# Patient Record
Sex: Male | Born: 1985 | Race: White | Hispanic: No | Marital: Single | Smoking: Current every day smoker
Health system: Southern US, Community
[De-identification: ages and names within clinical notes are randomized; demographics above are authoritative.]

## PROBLEM LIST (undated history)

## (undated) DIAGNOSIS — G43909 Migraine, unspecified, not intractable, without status migrainosus: Secondary | ICD-10-CM

## (undated) DIAGNOSIS — Z8661 Personal history of infections of the central nervous system: Secondary | ICD-10-CM

## (undated) HISTORY — PX: TONSILLECTOMY: SUR1361

---
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
## 2008-05-13 ENCOUNTER — Ambulatory Visit (HOSPITAL_COMMUNITY): Admission: RE | Admit: 2008-05-13 | Discharge: 2008-05-13 | Payer: Self-pay | Admitting: Family Medicine

## 2008-10-01 ENCOUNTER — Emergency Department (HOSPITAL_COMMUNITY): Admission: EM | Admit: 2008-10-01 | Discharge: 2008-10-01 | Payer: Self-pay | Admitting: Emergency Medicine

## 2010-02-02 ENCOUNTER — Emergency Department (HOSPITAL_COMMUNITY): Admission: EM | Admit: 2010-02-02 | Discharge: 2010-02-02 | Payer: Self-pay | Admitting: Emergency Medicine

## 2010-05-03 ENCOUNTER — Emergency Department (HOSPITAL_COMMUNITY): Admission: EM | Admit: 2010-05-03 | Discharge: 2010-05-03 | Payer: Self-pay | Admitting: Emergency Medicine

## 2010-09-07 ENCOUNTER — Emergency Department (HOSPITAL_COMMUNITY): Admission: EM | Admit: 2010-09-07 | Discharge: 2010-09-07 | Payer: Self-pay | Admitting: Emergency Medicine

## 2010-10-01 ENCOUNTER — Emergency Department (HOSPITAL_COMMUNITY): Admission: EM | Admit: 2010-10-01 | Discharge: 2010-10-01 | Payer: Self-pay | Admitting: Emergency Medicine

## 2011-01-15 ENCOUNTER — Encounter: Payer: Self-pay | Admitting: Neurology

## 2011-01-26 ENCOUNTER — Emergency Department (HOSPITAL_COMMUNITY)
Admission: EM | Admit: 2011-01-26 | Discharge: 2011-01-26 | Disposition: A | Discharge status: Home / Self Care | Payer: Self-pay | Attending: Emergency Medicine | Admitting: Emergency Medicine

## 2011-01-26 DIAGNOSIS — K047 Periapical abscess without sinus: Secondary | ICD-10-CM | POA: Insufficient documentation

## 2011-01-26 DIAGNOSIS — K089 Disorder of teeth and supporting structures, unspecified: Secondary | ICD-10-CM | POA: Insufficient documentation

## 2011-03-09 LAB — DIFFERENTIAL
Basophils Absolute: 0 10*3/uL (ref 0.0–0.1)
Basophils Relative: 0 % (ref 0–1)
Lymphocytes Relative: 24 % (ref 12–46)
Monocytes Absolute: 0.4 10*3/uL (ref 0.1–1.0)
Monocytes Relative: 7 % (ref 3–12)
Neutro Abs: 3.5 10*3/uL (ref 1.7–7.7)
Neutrophils Relative %: 67 % (ref 43–77)

## 2011-03-09 LAB — BASIC METABOLIC PANEL
Calcium: 9.3 mg/dL (ref 8.4–10.5)
GFR calc non Af Amer: >60 mL/min (ref >60)
Potassium: 3.9 mEq/L (ref 3.5–5.1)
Sodium: 137 mEq/L (ref 135–145)

## 2011-03-09 LAB — RAPID STREP SCREEN (MED CTR MEBANE ONLY): Streptococcus, Group A Screen (Direct): NEGATIVE

## 2011-03-09 LAB — CBC
HCT: 46.9 % (ref 39.0–52.0)
Hemoglobin: 16.3 g/dL (ref 13.0–17.0)
MCHC: 34.7 g/dL (ref 30.0–36.0)
RDW: 12.8 % (ref 11.5–15.5)
WBC: 5.3 10*3/uL (ref 4.0–10.5)

## 2011-10-09 ENCOUNTER — Emergency Department (HOSPITAL_COMMUNITY)
Admission: EM | Admit: 2011-10-09 | Discharge: 2011-10-09 | Disposition: A | Discharge status: Home / Self Care | Payer: Self-pay | Attending: Emergency Medicine | Admitting: Emergency Medicine

## 2011-10-09 ENCOUNTER — Encounter: Payer: Self-pay | Admitting: *Deleted

## 2011-10-09 DIAGNOSIS — J029 Acute pharyngitis, unspecified: Secondary | ICD-10-CM | POA: Insufficient documentation

## 2011-10-09 HISTORY — DX: Migraine, unspecified, not intractable, without status migrainosus: G43.909

## 2011-10-09 MED ORDER — IBUPROFEN 800 MG PO TABS
800.0000 mg | ORAL_TABLET | Freq: Once | ORAL | Status: AC
  Administered 2011-10-09: 800 mg via ORAL
  Filled 2011-10-09: qty 1

## 2011-10-09 NOTE — ED Notes (Signed)
C/o painful knot in right side of throat x 2 days

## 2011-10-09 NOTE — ED Notes (Signed)
Sore throat for 3-4 days, says he noticed white spots on throat.  Alert, NAD

## 2011-10-09 NOTE — ED Provider Notes (Signed)
History     CSN: [id number] Arrival date & time: 10/09/2011  9:38 PM  Chief Complaint  Patient presents with  . Sore Throat    (Consider location/radiation/quality/duration/timing/severity/associated sxs/prior treatment) Patient is a 25 y.o. male presenting with pharyngitis. The history is provided by the patient and a parent.  Sore Throat This is a new problem. Episode onset: 5 days ago. The problem occurs constantly. Associated symptoms include a sore throat and swollen glands. Pertinent negatives include no fever, headaches or nausea. The symptoms are aggravated by swallowing. He has tried nothing for the symptoms.    Past Medical History  Diagnosis Date  . Migraines     Past Surgical History  Procedure Date  . Tonsillectomy     No family history on file.  History  Substance Use Topics  . Smoking status: Current Everyday Smoker  . Smokeless tobacco: Not on file  . Alcohol Use: No      Review of Systems  Constitutional: Negative for fever.  HENT: Positive for sore throat.   Gastrointestinal: Negative for nausea.  Neurological: Negative for headaches.  All other systems reviewed and are negative.    Allergies  Review of patient's allergies indicates no known allergies.  Home Medications  No current outpatient prescriptions on file.  BP 135/77  Pulse 82  Temp(Src) 98.7 F (37.1 C) (Oral)  Resp 24  Ht 5\' 11"  (1.803 m)  Wt 182 lb (82.555 kg)  BMI 25.38 kg/m2  SpO2 99%  Physical Exam  Nursing note and vitals reviewed. Constitutional: He is oriented to person, place, and time. He appears well-developed and well-nourished.  HENT:  Head: Normocephalic and atraumatic.  Right Ear: External ear normal.  Left Ear: External ear normal.  Mouth/Throat: Uvula is midline and mucous membranes are normal. No uvula swelling. Oropharyngeal exudate present. No posterior oropharyngeal edema, posterior oropharyngeal erythema or tonsillar abscesses.  Eyes: EOM are  normal.  Neck: Normal range of motion.  Cardiovascular: Normal rate, regular rhythm, normal heart sounds and intact distal pulses.   Pulmonary/Chest: Effort normal and breath sounds normal. No respiratory distress. He has no wheezes. He has no rales. He exhibits no tenderness.  Abdominal: Soft. He exhibits no distension. There is no splenomegaly. There is no tenderness.  Musculoskeletal: Normal range of motion.  Lymphadenopathy:    He has cervical adenopathy.  Neurological: He is alert and oriented to person, place, and time.  Skin: Skin is warm and dry.  Psychiatric: He has a normal mood and affect. Judgment normal.    ED Course  Procedures (including critical care time)   Labs Reviewed  RAPID STREP SCREEN   No results found.   No diagnosis found.    MDM  Pt is to f/u with dr. Gerda Diss.  DNA probe for strep A pending.        Worthy Rancher, PA 10/09/11 [phone]

## 2011-10-11 LAB — STREP A DNA PROBE: Group A Strep Probe: NEGATIVE

## 2011-10-16 NOTE — ED Provider Notes (Signed)
Medical screening examination/treatment/procedure(s) were performed by non-physician practitioner and as supervising physician I was immediately available for consultation/collaboration.  Geovanna Simko S. Trish Mancinelli, MD 10/16/11 0444 

## 2012-03-13 IMAGING — CR DG CHEST 2V
2 series · 2 of 2 positions shown · non-contrast
Comparison: None.

CLINICAL DATA: Cough, congestion, fever

CHEST - 2 VIEW

[view not recorded (1 of 2)]
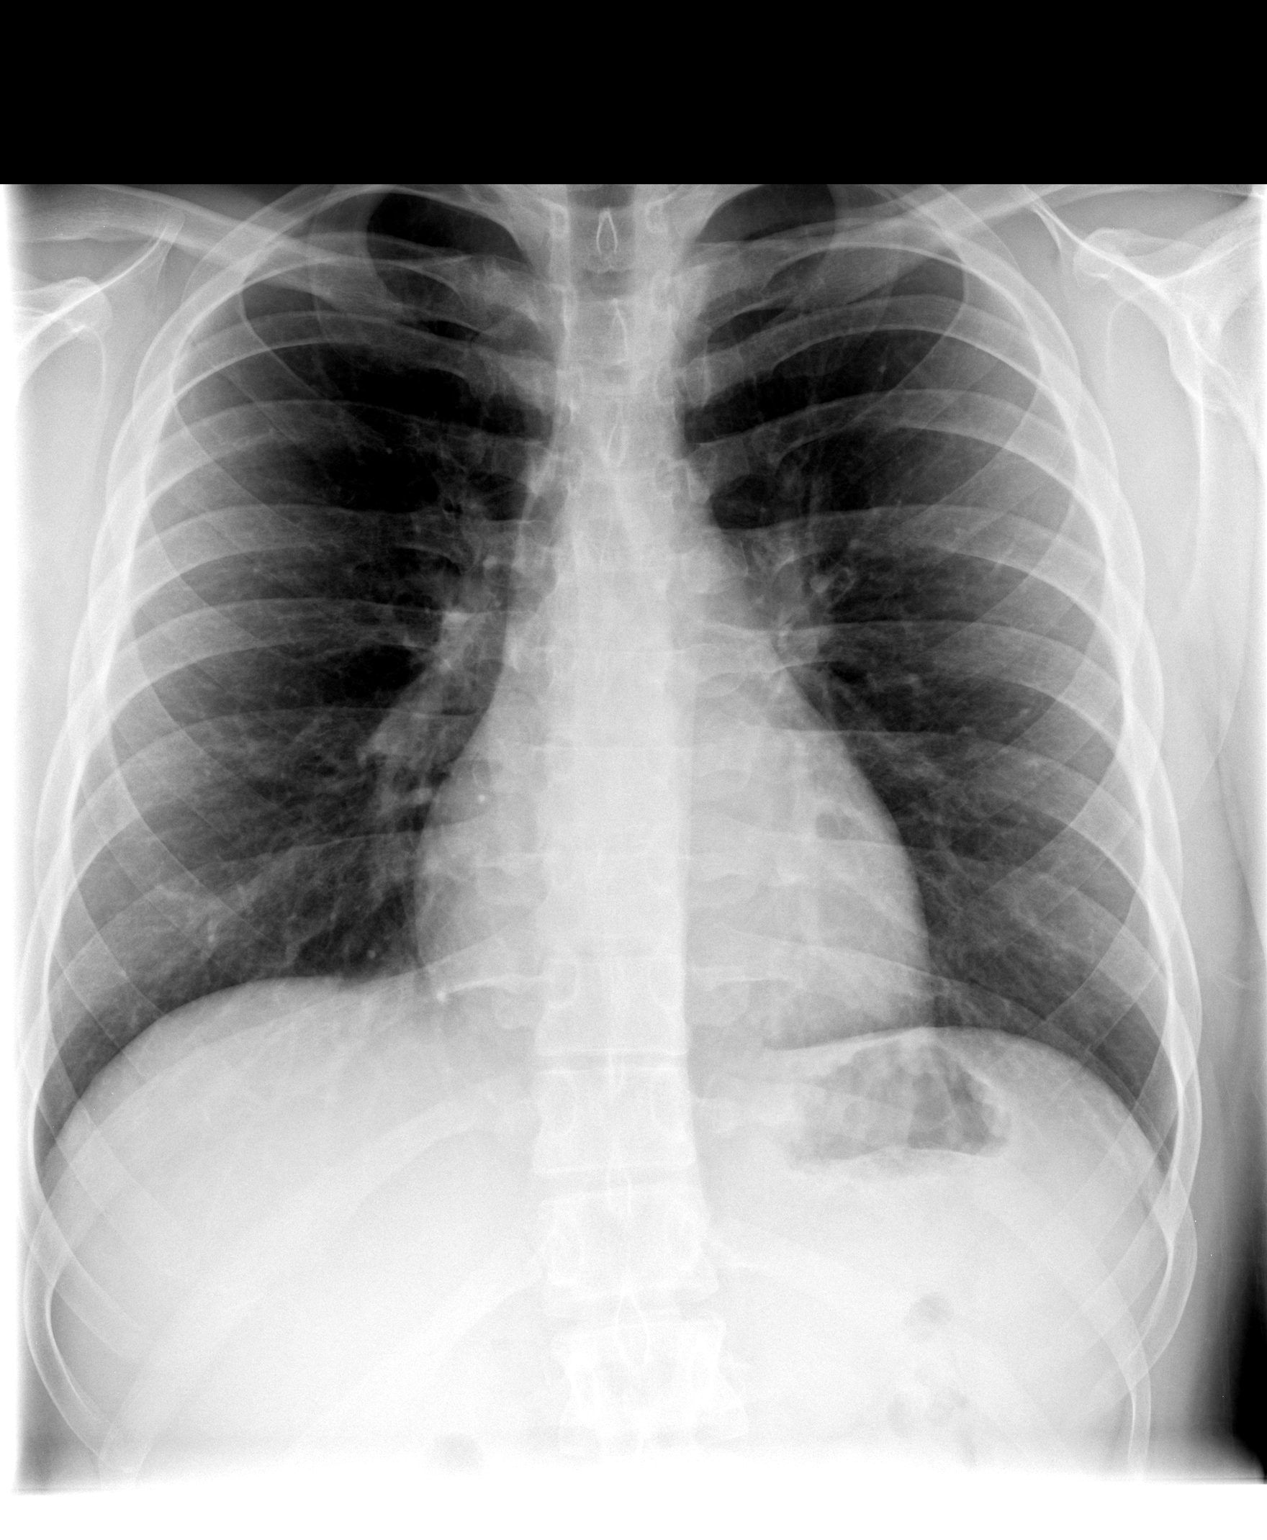

[view not recorded (2 of 2)]
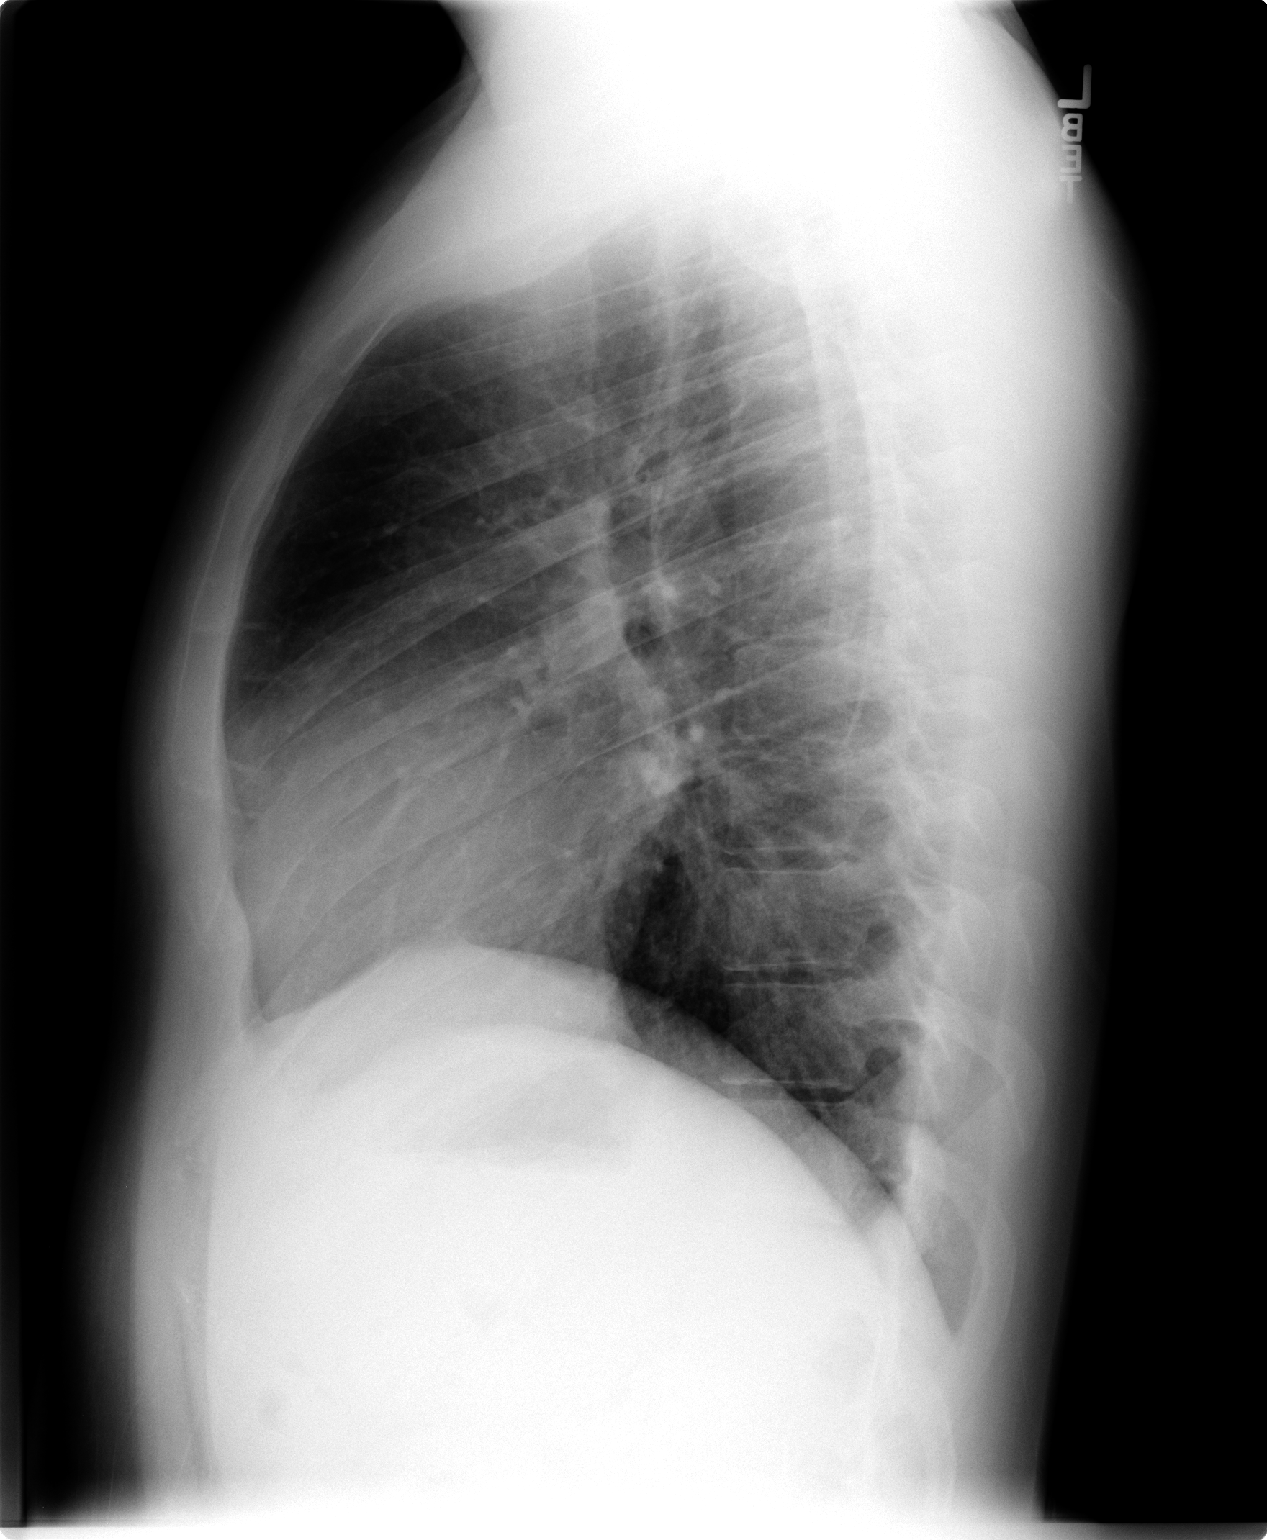

[2 of 2 positions shown; findings below may reference images not displayed]

FINDINGS: No active infiltrate or effusion is seen.  Mediastinal
contours appear normal.  The heart is within normal limits in size.
No bony abnormality is noted.
IMPRESSION: No active lung disease.

## 2013-05-08 ENCOUNTER — Encounter (HOSPITAL_COMMUNITY): Payer: Self-pay | Admitting: Emergency Medicine

## 2013-05-08 ENCOUNTER — Emergency Department (HOSPITAL_COMMUNITY)
Admission: EM | Admit: 2013-05-08 | Discharge: 2013-05-08 | Disposition: A | Discharge status: Home / Self Care | Payer: Self-pay | Attending: Emergency Medicine | Admitting: Emergency Medicine

## 2013-05-08 DIAGNOSIS — IMO0001 Reserved for inherently not codable concepts without codable children: Secondary | ICD-10-CM | POA: Insufficient documentation

## 2013-05-08 DIAGNOSIS — F17200 Nicotine dependence, unspecified, uncomplicated: Secondary | ICD-10-CM | POA: Insufficient documentation

## 2013-05-08 DIAGNOSIS — J029 Acute pharyngitis, unspecified: Secondary | ICD-10-CM | POA: Insufficient documentation

## 2013-05-08 DIAGNOSIS — R509 Fever, unspecified: Secondary | ICD-10-CM | POA: Insufficient documentation

## 2013-05-08 DIAGNOSIS — Z8679 Personal history of other diseases of the circulatory system: Secondary | ICD-10-CM | POA: Insufficient documentation

## 2013-05-08 LAB — RAPID STREP SCREEN (MED CTR MEBANE ONLY): Streptococcus, Group A Screen (Direct): NEGATIVE

## 2013-05-08 MED ORDER — PENICILLIN V POTASSIUM 500 MG PO TABS: 500.0000 mg | ORAL_TABLET | Freq: Four times a day (QID) | ORAL | Status: AC

## 2013-05-08 MED ORDER — HYDROCODONE-ACETAMINOPHEN 5-325 MG PO TABS: 1.0000 | ORAL_TABLET | ORAL | Status: DC | PRN

## 2013-05-08 NOTE — ED Provider Notes (Signed)
History     CSN: [id number]  Arrival date & time 05/08/13  1544   First MD Initiated Contact with Patient 05/08/13 [phone]      Chief Complaint  Patient presents with  . Chills  . Generalized Body Aches  . Fever    (Consider location/radiation/quality/duration/timing/severity/associated sxs/prior treatment) Patient is a 27 y.o. male presenting with pharyngitis. The history is provided by the patient.  Sore Throat This is a new problem. The current episode started in the past 7 days. The problem occurs constantly. The problem has been gradually worsening. Associated symptoms include chills, a fever, myalgias, nausea and a sore throat. Pertinent negatives include no abdominal pain, anorexia, change in bowel habit, chest pain, congestion, coughing, rash, swollen glands, urinary symptoms, visual change or vomiting. Headaches: mild. Neck pain: with movement. The symptoms are aggravated by eating, coughing, smoking and swallowing. He has tried NSAIDs for the symptoms. The treatment provided mild relief.   ADD DINAPOLI is a 27 y.o. male who presents to the ED with sore throat, fever and chills.   Past Medical History  Diagnosis Date  . Migraines     Past Surgical History  Procedure Laterality Date  . Tonsillectomy      History reviewed. No pertinent family history.  History  Substance Use Topics  . Smoking status: Current Every Day Smoker -- 1.00 packs/day    Types: Cigarettes  . Smokeless tobacco: Never Used  . Alcohol Use: No      Review of Systems  Constitutional: Positive for fever and chills.  HENT: Positive for sore throat. Negative for ear pain, congestion, neck stiffness and ear discharge. Neck pain: with movement.   Respiratory: Negative for cough and chest tightness.   Cardiovascular: Negative for chest pain.  Gastrointestinal: Positive for nausea. Negative for vomiting, abdominal pain, anorexia and change in bowel habit.  Genitourinary: Negative for dysuria and  urgency.  Musculoskeletal: Positive for myalgias.  Skin: Negative for rash.  Allergic/Immunologic: Negative for immunocompromised state.  Neurological: Headaches: mild.  Psychiatric/Behavioral: The patient is not nervous/anxious.     Allergies  Review of patient's allergies indicates no known allergies.  Home Medications  No current outpatient prescriptions on file.  BP 121/65  Pulse 102  Temp(Src) 100.9 F (38.3 C)  Resp 16  Ht 5\' 11"  (1.803 m)  Wt 192 lb (87.091 kg)  BMI 26.79 kg/m2  SpO2 99%  Physical Exam  Nursing note and vitals reviewed. Constitutional: He is oriented to person, place, and time. He appears well-developed and well-nourished. No distress.  HENT:  Head: Normocephalic and atraumatic.  Right Ear: Tympanic membrane normal.  Left Ear: Tympanic membrane normal.  Nose: Nose normal.  Mouth/Throat: Uvula is midline and mucous membranes are normal. Oropharyngeal exudate and posterior oropharyngeal erythema present.  Eyes: Conjunctivae and EOM are normal. Pupils are equal, round, and reactive to light.  Neck: Neck supple.  Cardiovascular: Tachycardia present.   Pulmonary/Chest: Effort normal and breath sounds normal. He has no wheezes.  Abdominal: Soft. Bowel sounds are normal. There is no tenderness. There is no CVA tenderness.  Musculoskeletal: Normal range of motion.  Lymphadenopathy:    He has cervical adenopathy.  Neurological: He is alert and oriented to person, place, and time. No cranial nerve deficit.  Skin: Skin is warm and dry.  Psychiatric: He has a normal mood and affect. His behavior is normal. Judgment and thought content normal.    Assessment: 27 y.o. male with sore throat and fever   Pharyngitis  Plan:  Culture sent for strep   Treat with antibiotics   Follow up with PCP  ED Course  Procedures (including critical care time)  MDM  I have reviewed this patient's vital signs, nurses notes and discussed clinical findings and plan of care  with the patient and his family. Patient stable for discharge home.          [facility] Orlene Och, [place] 05/08/13 [phone]

## 2013-05-08 NOTE — ED Notes (Signed)
Pt states that he has been having flu-like symptoms since Tuesday. Pt reports fever, chills and body aches.

## 2013-05-08 NOTE — ED Provider Notes (Signed)
Medical screening examination/treatment/procedure(s) were performed by non-physician practitioner and as supervising physician I was immediately available for consultation/collaboration.   Joya Gaskins, MD 05/08/13 [phone]

## 2013-08-11 ENCOUNTER — Encounter (HOSPITAL_COMMUNITY): Payer: Self-pay | Admitting: Emergency Medicine

## 2013-08-11 ENCOUNTER — Emergency Department (HOSPITAL_COMMUNITY)
Admission: EM | Admit: 2013-08-11 | Discharge: 2013-08-11 | Disposition: A | Discharge status: Home / Self Care | Payer: BC Managed Care – PPO | Attending: Emergency Medicine | Admitting: Emergency Medicine

## 2013-08-11 DIAGNOSIS — K089 Disorder of teeth and supporting structures, unspecified: Secondary | ICD-10-CM | POA: Insufficient documentation

## 2013-08-11 DIAGNOSIS — F17200 Nicotine dependence, unspecified, uncomplicated: Secondary | ICD-10-CM | POA: Insufficient documentation

## 2013-08-11 DIAGNOSIS — Z8679 Personal history of other diseases of the circulatory system: Secondary | ICD-10-CM | POA: Insufficient documentation

## 2013-08-11 DIAGNOSIS — K029 Dental caries, unspecified: Secondary | ICD-10-CM

## 2013-08-11 DIAGNOSIS — R599 Enlarged lymph nodes, unspecified: Secondary | ICD-10-CM | POA: Insufficient documentation

## 2013-08-11 MED ORDER — AMOXICILLIN 500 MG PO CAPS: 500.0000 mg | ORAL_CAPSULE | Freq: Three times a day (TID) | ORAL | Status: DC

## 2013-08-11 MED ORDER — NAPROXEN 500 MG PO TABS: 500.0000 mg | ORAL_TABLET | Freq: Two times a day (BID) | ORAL | Status: DC

## 2013-08-11 MED ORDER — HYDROCODONE-ACETAMINOPHEN 5-325 MG PO TABS
1.0000 | ORAL_TABLET | Freq: Once | ORAL | Status: AC
  Administered 2013-08-11: 1 via ORAL
  Filled 2013-08-11: qty 1

## 2013-08-11 MED ORDER — AMOXICILLIN 250 MG PO CAPS
500.0000 mg | ORAL_CAPSULE | Freq: Once | ORAL | Status: AC
  Administered 2013-08-11: 500 mg via ORAL
  Filled 2013-08-11: qty 2

## 2013-08-11 NOTE — ED Provider Notes (Signed)
CSN: [id number]     Arrival date & time 08/11/13  1913 History     First MD Initiated Contact with Patient 08/11/13 1923     Chief Complaint  Patient presents with  . Dental Pain   (Consider location/radiation/quality/duration/timing/severity/associated sxs/prior Treatment) Patient is a 27 y.o. male presenting with tooth pain. The history is provided by the patient.  Dental Pain Location:  Lower Lower teeth location:  18/LL 2nd molar Quality:  Throbbing and constant Severity:  Moderate Onset quality:  Gradual Duration:  24 hours Timing:  Constant Progression:  Worsening Chronicity:  New Context: dental caries   Relieved by:  Nothing Worsened by:  Cold food/drink and hot food/drink Associated symptoms: no congestion, no difficulty swallowing, no facial swelling, no fever, no headaches, no neck pain, no oral bleeding and no trismus   Risk factors: lack of dental care and smoking    Philip Hughes is a 27 y.o. male who presents to the ED with dental pain that started last night.    Past Medical History  Diagnosis Date  . Migraines    Past Surgical History  Procedure Laterality Date  . Tonsillectomy     No family history on file. History  Substance Use Topics  . Smoking status: Current Every Day Smoker -- 1.00 packs/day    Types: Cigarettes  . Smokeless tobacco: Never Used  . Alcohol Use: Yes    Review of Systems  Constitutional: Negative for fever and chills.  HENT: Positive for dental problem. Negative for congestion, sore throat, facial swelling, trouble swallowing and neck pain.   Respiratory: Negative for shortness of breath.   Gastrointestinal: Negative for nausea and vomiting.  Skin: Negative for rash.  Neurological: Negative for headaches.  Psychiatric/Behavioral: The patient is not nervous/anxious.     Allergies  Review of patient's allergies indicates no known allergies.  Home Medications   Current Outpatient Rx  Name  Route  Sig  Dispense  Refill   . aspirin 325 MG tablet   Oral   Take 325 mg by mouth every 6 (six) hours as needed for fever.         Marland Kitchen HYDROcodone-acetaminophen (NORCO/VICODIN) 5-325 MG per tablet   Oral   Take 1 tablet by mouth every 4 (four) hours as needed.   10 tablet   0    BP 122/73  Pulse 84  Temp(Src) 98 F (36.7 C) (Oral)  Resp 18  Ht 5\' 11"  (1.803 m)  Wt 192 lb (87.091 kg)  BMI 26.79 kg/m2  SpO2 100% Physical Exam  Nursing note and vitals reviewed. Constitutional: He is oriented to person, place, and time. He appears well-developed and well-nourished.  HENT:  Head: Normocephalic.  Mouth/Throat: Uvula is midline, oropharynx is clear and moist and mucous membranes are normal. Dental caries present.    Eyes: Conjunctivae and EOM are normal.  Neck: Neck supple.  Cardiovascular: Normal rate and regular rhythm.   Pulmonary/Chest: Effort normal and breath sounds normal.  Musculoskeletal: Normal range of motion.  Lymphadenopathy:    He has cervical adenopathy (right).  Neurological: He is alert and oriented to person, place, and time. No cranial nerve deficit.  Skin: Skin is warm and dry.  Psychiatric: He has a normal mood and affect. His behavior is normal. Thought content normal.    ED Course   Procedures  MDM  27 y.o. male with dental pain. Multiple caries. Will treat with antibiotics and medication for inflammation. He is to follow up with the dental  clinic as soon as possible.  Discussed with the patient and all questioned fully answered. He will return here if problems arise.    Medication List    TAKE these medications       amoxicillin 500 MG capsule  Commonly known as:  AMOXIL  Take 1 capsule (500 mg total) by mouth 3 (three) times daily.     naproxen 500 MG tablet  Commonly known as:  NAPROSYN  Take 1 tablet (500 mg total) by mouth 2 (two) times daily.      ASK your doctor about these medications       aspirin 325 MG tablet  Take 325 mg by mouth every 6 (six) hours  as needed for fever.     HYDROcodone-acetaminophen 5-325 MG per tablet  Commonly known as:  NORCO/VICODIN  Take 1 tablet by mouth every 4 (four) hours as needed.         [facility] Orlene Och, [place] 08/11/13 [phone]

## 2013-08-11 NOTE — ED Provider Notes (Signed)
History/physical exam/procedure(s) were performed by non-physician practitioner and as supervising physician I was immediately available for consultation/collaboration. I have reviewed all notes and am in agreement with care and plan.   Clarrissa Shimkus S Mamadou Breon, MD 08/11/13 2349 

## 2013-08-11 NOTE — ED Notes (Signed)
Patient c/o lower left dental pain that started approximately 3 hours ago.

## 2014-12-28 ENCOUNTER — Encounter (HOSPITAL_COMMUNITY): Payer: Self-pay | Admitting: Emergency Medicine

## 2014-12-28 ENCOUNTER — Emergency Department (HOSPITAL_COMMUNITY): Payer: BLUE CROSS/BLUE SHIELD

## 2014-12-28 ENCOUNTER — Emergency Department (HOSPITAL_COMMUNITY)
Admission: EM | Admit: 2014-12-28 | Discharge: 2014-12-28 | Disposition: A | Discharge status: Home / Self Care | Payer: BLUE CROSS/BLUE SHIELD | Attending: Emergency Medicine | Admitting: Emergency Medicine

## 2014-12-28 DIAGNOSIS — Z791 Long term (current) use of non-steroidal anti-inflammatories (NSAID): Secondary | ICD-10-CM | POA: Insufficient documentation

## 2014-12-28 DIAGNOSIS — J0100 Acute maxillary sinusitis, unspecified: Secondary | ICD-10-CM | POA: Insufficient documentation

## 2014-12-28 DIAGNOSIS — J209 Acute bronchitis, unspecified: Secondary | ICD-10-CM | POA: Diagnosis not present

## 2014-12-28 DIAGNOSIS — Z7982 Long term (current) use of aspirin: Secondary | ICD-10-CM | POA: Diagnosis not present

## 2014-12-28 DIAGNOSIS — R059 Cough, unspecified: Secondary | ICD-10-CM

## 2014-12-28 DIAGNOSIS — Z792 Long term (current) use of antibiotics: Secondary | ICD-10-CM | POA: Insufficient documentation

## 2014-12-28 DIAGNOSIS — Z87891 Personal history of nicotine dependence: Secondary | ICD-10-CM | POA: Insufficient documentation

## 2014-12-28 DIAGNOSIS — J40 Bronchitis, not specified as acute or chronic: Secondary | ICD-10-CM

## 2014-12-28 DIAGNOSIS — R05 Cough: Secondary | ICD-10-CM

## 2014-12-28 MED ORDER — AZITHROMYCIN 250 MG PO TABS
500.0000 mg | ORAL_TABLET | Freq: Once | ORAL | Status: AC
  Administered 2014-12-28: 500 mg via ORAL
  Filled 2014-12-28: qty 2

## 2014-12-28 MED ORDER — AZITHROMYCIN 250 MG PO TABS: 250.0000 mg | ORAL_TABLET | Freq: Every day | ORAL | Status: DC

## 2014-12-28 NOTE — ED Notes (Addendum)
PT reports nasal congestion and sinus pressure with reports green nasal drainage x4 days. PT reports using dayquil this am. PT also reports a productive cough with green sputum but denies any SOB.

## 2014-12-28 NOTE — ED Provider Notes (Signed)
CSN: [id number]     Arrival date & time 12/28/14  1236 History  This chart was scribed for Benny Lennert, MD by Ronney Lion, ED Scribe. This patient was seen in room APA09/APA09 and the patient's care was started at 2:45 PM.    Chief Complaint  Patient presents with  . Recurrent Sinusitis   Patient is a 29 y.o. male presenting with cough. The history is provided by the patient (pt complains of a cough). No language interpreter was used.  Cough Cough characteristics:  Non-productive Severity:  Moderate Onset quality:  Gradual Timing:  Constant Progression:  Waxing and waning Chronicity:  New Smoker: no   Context: not animal exposure   Associated symptoms: sore throat   Associated symptoms: no chest pain, no eye discharge, no headaches and no rash    HPI Comments: Philip Hughes is a 29 y.o. male who presents to the Emergency Department complaining of worsening chest congestion, nasal congestion, and sore throat, with onset 4 days ago. He denies smoking.   Past Medical History  Diagnosis Date  . Migraines    Past Surgical History  Procedure Laterality Date  . Tonsillectomy     No family history on file. History  Substance Use Topics  . Smoking status: Former Smoker -- 1.00 packs/day    Types: Cigarettes    Quit date: 05/08/2014  . Smokeless tobacco: Never Used  . Alcohol Use: Yes    Review of Systems  Constitutional: Negative for appetite change and fatigue.  HENT: Positive for congestion, sinus pressure and sore throat. Negative for ear discharge.   Eyes: Negative for discharge.  Respiratory: Positive for cough.   Cardiovascular: Negative for chest pain.  Gastrointestinal: Negative for abdominal pain and diarrhea.  Genitourinary: Negative for frequency and hematuria.  Musculoskeletal: Negative for back pain.  Skin: Negative for rash.  Neurological: Negative for seizures and headaches.  Psychiatric/Behavioral: Negative for hallucinations.      Allergies  Review  of patient's allergies indicates no known allergies.  Home Medications   Prior to Admission medications   Medication Sig Start Date End Date Taking? Authorizing Provider  amoxicillin (AMOXIL) 500 MG capsule Take 1 capsule (500 mg total) by mouth 3 (three) times daily. 08/11/13   Hope Orlene Och, NP  aspirin 325 MG tablet Take 325 mg by mouth every 6 (six) hours as needed for fever.    Historical Provider, MD  HYDROcodone-acetaminophen (NORCO/VICODIN) 5-325 MG per tablet Take 1 tablet by mouth every 4 (four) hours as needed. 05/08/13   Hope Orlene Och, NP  naproxen (NAPROSYN) 500 MG tablet Take 1 tablet (500 mg total) by mouth 2 (two) times daily. 08/11/13   Hope Orlene Och, NP   BP 138/76 mmHg  Pulse 80  Temp(Src) 98.3 F (36.8 C) (Oral)  Resp 18  Ht  (1.803 m)  Wt 198 lb (89.812 kg)  BMI 27.63 kg/m2  SpO2 99% Physical Exam  Constitutional: He is oriented to person, place, and time. He appears well-developed.  HENT:  Head: Normocephalic.  Moderate tenderness to maxillary sinuses.  Eyes: Conjunctivae and EOM are normal. No scleral icterus.  Neck: Neck supple. No thyromegaly present.  Cardiovascular: Normal rate and regular rhythm.  Exam reveals no gallop and no friction rub.   No murmur heard. Pulmonary/Chest: No stridor. He has no wheezes. He has no rales. He exhibits no tenderness.  Abdominal: He exhibits no distension. There is no tenderness. There is no rebound.  Musculoskeletal: Normal range of motion.  He exhibits no edema.  Lymphadenopathy:    He has no cervical adenopathy.  Neurological: He is oriented to person, place, and time. He exhibits normal muscle tone. Coordination normal.  Skin: No rash noted. No erythema.  Psychiatric: He has a normal mood and affect. His behavior is normal.  Nursing note and vitals reviewed.   ED Course  Procedures (including critical care time)  DIAGNOSTIC STUDIES: Oxygen Saturation is 99% on room air, normal by my interpretation.     COORDINATION OF CARE: 2:46 PM - Discussed treatment plan with pt at bedside which includes antibiotic medication, and instructions to increase fluid intake and take OTC medication as needed, and pt agreed to plan.   Labs Review Labs Reviewed - No data to display  Imaging Review Dg Chest 2 View  12/28/2014   CLINICAL DATA:  Nasal congestion. Sinus pressure. Free nasal discharge for 4 days.  EXAM: CHEST  2 VIEW  COMPARISON:  09/07/2010.  FINDINGS: Cardiopericardial silhouette within normal limits. Mediastinal contours normal. Trachea midline. No airspace disease or effusion.  IMPRESSION: No active cardiopulmonary disease.   Electronically Signed   By: Andreas Newport M.D.   On: 12/28/2014 13:13     EKG Interpretation None      MDM   Final diagnoses:  Cough   Sinusitis,  Bronchitis.   zpack  The chart was scribed for me under my direct supervision.  I personally performed the history, physical, and medical decision making and all procedures in the evaluation of this patient.Benny Lennert, MD 12/28/14 [phone]

## 2014-12-28 NOTE — Discharge Instructions (Signed)
Tylenol or motrin for pain.  Rest at home 2 days.  Follow up as needed.  Drink plenty of fluids

## 2015-01-02 ENCOUNTER — Emergency Department (HOSPITAL_COMMUNITY)
Admission: EM | Admit: 2015-01-02 | Discharge: 2015-01-02 | Disposition: A | Discharge status: Home / Self Care | Payer: BLUE CROSS/BLUE SHIELD | Attending: Emergency Medicine | Admitting: Emergency Medicine

## 2015-01-02 ENCOUNTER — Emergency Department (HOSPITAL_COMMUNITY): Payer: BLUE CROSS/BLUE SHIELD

## 2015-01-02 ENCOUNTER — Encounter (HOSPITAL_COMMUNITY): Payer: Self-pay | Admitting: Emergency Medicine

## 2015-01-02 DIAGNOSIS — Z87891 Personal history of nicotine dependence: Secondary | ICD-10-CM | POA: Diagnosis not present

## 2015-01-02 DIAGNOSIS — Z791 Long term (current) use of non-steroidal anti-inflammatories (NSAID): Secondary | ICD-10-CM | POA: Insufficient documentation

## 2015-01-02 DIAGNOSIS — Z8679 Personal history of other diseases of the circulatory system: Secondary | ICD-10-CM | POA: Diagnosis not present

## 2015-01-02 DIAGNOSIS — M25532 Pain in left wrist: Secondary | ICD-10-CM | POA: Insufficient documentation

## 2015-01-02 DIAGNOSIS — Z79899 Other long term (current) drug therapy: Secondary | ICD-10-CM | POA: Diagnosis not present

## 2015-01-02 MED ORDER — NAPROXEN 500 MG PO TABS: 500.0000 mg | ORAL_TABLET | Freq: Two times a day (BID) | ORAL | Status: DC

## 2015-01-02 MED ORDER — HYDROCODONE-ACETAMINOPHEN 5-325 MG PO TABS: 1.0000 | ORAL_TABLET | ORAL | Status: DC | PRN

## 2015-01-02 NOTE — ED Notes (Signed)
Patient with no complaints at this time. Respirations even and unlabored. Skin warm/dry. Discharge instructions reviewed with patient at this time. Patient given opportunity to voice concerns/ask questions. Patient discharged at this time and left Emergency Department with steady gait.   

## 2015-01-02 NOTE — Discharge Instructions (Signed)
We are treating you with pain medication and medication for inflammation. You may be developing carpal tunnel syndrome from repetitive motion. If the symptoms persist follow up with Dr. Romeo AppleHarrison. Return here as needed.

## 2015-01-02 NOTE — ED Provider Notes (Signed)
CSN: [id number]     Arrival date & time 01/02/15  [id number] History   First MD Initiated Contact with Patient 01/02/15 [phone]     Chief Complaint  Patient presents with  . Wrist Pain     (Consider location/radiation/quality/duration/timing/severity/associated sxs/prior Treatment) Patient is a 29 y.o. male presenting with wrist pain. The history is provided by the patient.  Wrist Pain This is a new problem. The current episode started yesterday. The problem occurs constantly. The problem has been unchanged. Exacerbated by: movement of wrist. Treatments tried: ace wrap. The treatment provided mild relief.   Adella HareLance A Hoffman is a 29 y.o. male who presents to the ED with left wrist pain that started yesterday. He states that he has been on break from work for the past 3 weeks and went back yesterday. He does repetitive motion with his hands. He has been on the job for over a year and never had any problems. Yesterday he states he was trying to work fast to get his production back up and began having pain and then swelling to the left wrist. He is right hand dominant.   Past Medical History  Diagnosis Date  . Migraines    Past Surgical History  Procedure Laterality Date  . Tonsillectomy     No family history on file. History  Substance Use Topics  . Smoking status: Former Smoker -- 1.00 packs/day    Types: Cigarettes    Quit date: 05/08/2014  . Smokeless tobacco: Never Used  . Alcohol Use: Yes    Review of Systems Negative except as stated in HPI   Allergies  Review of patient's allergies indicates no known allergies.  Home Medications   Prior to Admission medications   Medication Sig Start Date End Date Taking? Authorizing Provider  HYDROcodone-acetaminophen (NORCO/VICODIN) 5-325 MG per tablet Take 1 tablet by mouth every 4 (four) hours as needed. 01/02/15   Zharia Conrow Orlene OchM Kristelle Cavallaro, NP  HYDROcodone-acetaminophen (NORCO/VICODIN) 5-325 MG per tablet Take 1 tablet by mouth every 4 (four) hours as  needed. 01/02/15   Camerin Ladouceur Orlene OchM Morgaine Kimball, NP  naproxen (NAPROSYN) 500 MG tablet Take 1 tablet (500 mg total) by mouth 2 (two) times daily. 01/02/15   Dawaun Brancato Orlene OchM Jafari Mckillop, NP  naproxen (NAPROSYN) 500 MG tablet Take 1 tablet (500 mg total) by mouth 2 (two) times daily. 01/02/15   Ravi Tuccillo Orlene OchM Tamra Koos, NP   BP 143/84 mmHg  Pulse 97  Temp(Src) 98.4 F (36.9 C) (Oral)  Resp 15  Ht 5\' 11"  (1.803 m)  Wt 198 lb (89.812 kg)  BMI 27.63 kg/m2  SpO2 98% Physical Exam  Constitutional: He is oriented to person, place, and time. He appears well-developed and well-nourished.  HENT:  Head: Normocephalic.  Eyes: EOM are normal.  Neck: Neck supple.  Cardiovascular: Normal rate.   Pulmonary/Chest: Effort normal.  Musculoskeletal: Normal range of motion.       Left wrist: He exhibits tenderness and swelling. He exhibits normal range of motion, no crepitus, no deformity and no laceration.  Radial pulses equal, adequate circulation, good touch sensation. There is increased warmth to the radial aspect of the wrist with swelling and tenderness with range of motion and palpation.   Neurological: He is alert and oriented to person, place, and time. No cranial nerve deficit.  Skin: Skin is warm and dry.  Psychiatric: He has a normal mood and affect. His behavior is normal.  Nursing note and vitals reviewed.   ED Course  Procedures (including critical care time) Labs  Review Labs Reviewed - No data to display  Imaging Review Dg Wrist Complete Left  01/02/2015   CLINICAL DATA:  Pain at the left distal radius after repetitive motion at work. Left wrist swelling. Initial encounter.  EXAM: LEFT WRIST - COMPLETE 3+ VIEW  COMPARISON:  None.  FINDINGS: There is no evidence of fracture or dislocation. The carpal rows are intact, and demonstrate normal alignment. The joint spaces are preserved.  Mild soft tissue swelling is suggested about the distal radius.  IMPRESSION: No evidence of fracture or dislocation. Mild soft tissue swelling suggested  about the distal radius. If the patient's symptoms persist, MRI could be considered for further evaluation.   Electronically Signed   By: Roanna Raider M.D.   On: 01/02/2015 09:25      MDM  29 y.o. male with left wrist pain x 2 days. Placed in wrist splint, ice, rest pain management and follow up with ortho. Patient stable for discharge without neurovascular deficits. Discussed possible wrist sprain vs developing carpal tunnel syndrome. Discussed with the patient and all questioned fully answered.      Medication List    STOP taking these medications        amoxicillin 500 MG capsule  Commonly known as:  AMOXIL     azithromycin 250 MG tablet  Commonly known as:  ZITHROMAX      TAKE these medications        HYDROcodone-acetaminophen 5-325 MG per tablet  Commonly known as:  NORCO/VICODIN  Take 1 tablet by mouth every 4 (four) hours as needed.     naproxen 500 MG tablet  Commonly known as:  NAPROSYN  Take 1 tablet (500 mg total) by mouth 2 (two) times daily.          Final diagnoses:  Wrist pain, acute, left      [facility], NP 01/02/15 1610  Hilario Quarry, MD 01/03/15 [phone]

## 2015-01-02 NOTE — ED Notes (Signed)
Patient c/o left wrist pain starting after break from work. His work requires him to do repetitive motions with wrist. C/o swelling and pain with movement/pressure. CMS intact.

## 2015-01-02 NOTE — ED Notes (Signed)
Pt c/o pain and swelling in left wrist after being at working having to do repatitive motion.

## 2015-02-02 ENCOUNTER — Emergency Department (HOSPITAL_COMMUNITY)
Admission: EM | Admit: 2015-02-02 | Discharge: 2015-02-02 | Disposition: A | Discharge status: Home / Self Care | Payer: BLUE CROSS/BLUE SHIELD | Attending: Emergency Medicine | Admitting: Emergency Medicine

## 2015-02-02 ENCOUNTER — Emergency Department (HOSPITAL_COMMUNITY): Payer: BLUE CROSS/BLUE SHIELD

## 2015-02-02 ENCOUNTER — Encounter (HOSPITAL_COMMUNITY): Payer: Self-pay | Admitting: Emergency Medicine

## 2015-02-02 DIAGNOSIS — Z79899 Other long term (current) drug therapy: Secondary | ICD-10-CM | POA: Insufficient documentation

## 2015-02-02 DIAGNOSIS — S0532XA Ocular laceration without prolapse or loss of intraocular tissue, left eye, initial encounter: Secondary | ICD-10-CM | POA: Insufficient documentation

## 2015-02-02 DIAGNOSIS — W19XXXA Unspecified fall, initial encounter: Secondary | ICD-10-CM

## 2015-02-02 DIAGNOSIS — Z8679 Personal history of other diseases of the circulatory system: Secondary | ICD-10-CM | POA: Diagnosis not present

## 2015-02-02 DIAGNOSIS — S43102A Unspecified dislocation of left acromioclavicular joint, initial encounter: Secondary | ICD-10-CM

## 2015-02-02 DIAGNOSIS — Z87891 Personal history of nicotine dependence: Secondary | ICD-10-CM | POA: Insufficient documentation

## 2015-02-02 DIAGNOSIS — Z791 Long term (current) use of non-steroidal anti-inflammatories (NSAID): Secondary | ICD-10-CM | POA: Diagnosis not present

## 2015-02-02 DIAGNOSIS — Y998 Other external cause status: Secondary | ICD-10-CM | POA: Insufficient documentation

## 2015-02-02 DIAGNOSIS — R400 Somnolence: Secondary | ICD-10-CM | POA: Insufficient documentation

## 2015-02-02 DIAGNOSIS — Y9289 Other specified places as the place of occurrence of the external cause: Secondary | ICD-10-CM | POA: Diagnosis not present

## 2015-02-02 DIAGNOSIS — W108XXA Fall (on) (from) other stairs and steps, initial encounter: Secondary | ICD-10-CM | POA: Insufficient documentation

## 2015-02-02 DIAGNOSIS — Y9389 Activity, other specified: Secondary | ICD-10-CM | POA: Insufficient documentation

## 2015-02-02 DIAGNOSIS — S0181XA Laceration without foreign body of other part of head, initial encounter: Secondary | ICD-10-CM

## 2015-02-02 MED ORDER — OXYCODONE-ACETAMINOPHEN 5-325 MG PO TABS: 1.0000 | ORAL_TABLET | ORAL | Status: DC | PRN

## 2015-02-02 MED ORDER — IBUPROFEN 800 MG PO TABS
800.0000 mg | ORAL_TABLET | Freq: Once | ORAL | Status: AC
  Administered 2015-02-02: 800 mg via ORAL
  Filled 2015-02-02: qty 1

## 2015-02-02 NOTE — ED Provider Notes (Signed)
CSN: [id number]     Arrival date & time 02/02/15  1139 History  This chart was scribed for Hilario Quarry, MD by Tonye Royalty, ED Scribe. This patient was seen in room APA12/APA12 and the patient's care was started at 3:33 PM.    Chief Complaint  Patient presents with  . Fall   Patient is a 29 y.o. male presenting with fall. The history is provided by the patient. No language interpreter was used.  Fall This is a new problem. The current episode started yesterday. Episode frequency: once. The problem has not changed since onset.Pertinent negatives include no abdominal pain and no headaches. Nothing aggravates the symptoms. Nothing relieves the symptoms. He has tried nothing for the symptoms.    HPI Comments: Philip Hughes is a 29 y.o. male who presents to the Emergency Department complaining of fall last night with LOC. He states he lost his balance and fell down 8-9 stairs. He reports injuring to his left eyebrow and LOC for 2-3 minutes. He states he used alcohol last night and was at a friend's house when he fell; he states his friend witnessed the fall.  He states he has been okay since then besides pain to his left shoulder. He states he has not used anything for his pain. He states his last tetanus shot was 2-3 yeas ago. He denies any health problems and states he does not use any medications daily. He denies any allergies. He states he smokes and uses alcohol. He denies abdominal problems from alcohol use. He states he has seen Dr. Hilda Lias in the past for ankle fracture.    Past Medical History  Diagnosis Date  . Migraines    Past Surgical History  Procedure Laterality Date  . Tonsillectomy     Family History  Problem Relation Age of Onset  . Diabetes Mother   . Asthma Mother   . Cancer Other   . Hypertension Other    History  Substance Use Topics  . Smoking status: Former Smoker -- 1.00 packs/day    Types: Cigarettes    Quit date: 05/08/2014  . Smokeless tobacco: Never Used   . Alcohol Use: Yes    Review of Systems  Gastrointestinal: Negative for abdominal pain.  Musculoskeletal: Negative for back pain and neck pain.       Left shoulder pain  Skin: Positive for wound.  Neurological: Negative for dizziness and headaches.       LOC  All other systems reviewed and are negative.     Allergies  Review of patient's allergies indicates no known allergies.  Home Medications   Prior to Admission medications   Medication Sig Start Date End Date Taking? Authorizing Provider  HYDROcodone-acetaminophen (NORCO/VICODIN) 5-325 MG per tablet Take 1 tablet by mouth every 4 (four) hours as needed. 01/02/15   Hope Orlene Och, NP  naproxen (NAPROSYN) 500 MG tablet Take 1 tablet (500 mg total) by mouth 2 (two) times daily. 01/02/15   Hope Orlene Och, NP   BP 150/74 mmHg  Pulse 104  Temp(Src) 98.1 F (36.7 C) (Oral)  Resp 18  Ht  (1.803 m)  Wt 192 lb (87.091 kg)  BMI 26.79 kg/m2  SpO2 99% Physical Exam  Constitutional: He is oriented to person, place, and time. He appears well-developed and well-nourished.  HENT:  Head: Normocephalic and atraumatic.  Right Ear: Tympanic membrane and external ear normal.  Left Ear: Tympanic membrane and external ear normal.  Nose: Nose normal. Right sinus exhibits  no maxillary sinus tenderness and no frontal sinus tenderness. Left sinus exhibits no maxillary sinus tenderness and no frontal sinus tenderness.  No Battle's sign No hemotympanum Entire head nontender  Eyes: Conjunctivae and EOM are normal. Pupils are equal, round, and reactive to light. Right eye exhibits no nystagmus. Left eye exhibits no nystagmus.  Neck: Normal range of motion. Neck supple.  No cervical tenderness  Cardiovascular: Normal rate, regular rhythm, normal heart sounds and intact distal pulses.   Pulmonary/Chest: Effort normal and breath sounds normal. No respiratory distress. He exhibits no tenderness.  Abdominal: Soft. Bowel sounds are normal. He exhibits  no distension and no mass. There is no tenderness.  Musculoskeletal: Normal range of motion. He exhibits no edema or tenderness.  Trunk normal with exception of tenderness to left trapezius muscle External rotation decreased by a few degrees to left shoulder, decreased extension on active ROM but can raise to almost shoulder height Rest of ROM intact  Neurological: He is alert and oriented to person, place, and time. He has normal strength and normal reflexes. No cranial nerve deficit. He exhibits normal muscle tone. Coordination normal.  Skin: Skin is warm and dry. No rash noted.  3cm healing laceration lateral to left eye with eschar in place  Psychiatric: He has a normal mood and affect. His behavior is normal. Judgment and thought content normal.  Nursing note and vitals reviewed.   ED Course  Procedures (including critical care time)  DIAGNOSTIC STUDIES: Oxygen Saturation is 99% on room air, normal by my interpretation.    COORDINATION OF CARE: 3:40 PM Discussed treatment plan with patient at beside, including x-Danen Lapaglia of his shoulder. The patient agrees with the plan and has no further questions at this time.   Labs Review Labs Reviewed - No data to display  Imaging Review Dg Shoulder Left  02/02/2015   CLINICAL DATA:  Left shoulder pain since a fall down basement steps at 9 p.m. last night.  EXAM: LEFT SHOULDER - 2+ VIEW  COMPARISON:  None.  FINDINGS: Grade 2 acromioclavicular separation. No evidence of acute fracture. Glenohumeral joint appears intact. No focal bone lesion or bone destruction. Soft tissues appear intact.  IMPRESSION: Grade 2 acromioclavicular separation.   Electronically Signed   By: Burman NievesWilliam  Stevens M.D.   On: 02/02/2015 17:09     EKG Interpretation None      MDM   Final diagnoses:  AC separation, type 2, left, initial encounter  Facial laceration, initial encounter  Fall, initial encounter    29 year old male who fell down the steps last night. He has  a facial laceration that is healing. It will not be sutured as this occurred last night and feeling well. Tetanus is up-to-date. He complains of left shoulder pain is tender over the trapezius muscle. X-Jaceyon Strole shows left grade 2 acromioclavicular  separation. Patient is placed in a shoulder immobilizer and referred to Dr. Hilda LiasKeeling who he has seen before. Patient was given information, return precautions, advised need for follow-up.  I personally performed the services described in this documentation, which was scribed in my presence. The recorded information has been reviewed and considered.   Hilario Quarryanielle S Shawne Eskelson, MD 02/02/15 [id number]

## 2015-02-02 NOTE — Discharge Instructions (Signed)
Acromioclavicular Injuries °The acromioclavicular (AC) joint is the joint in the shoulder. There are many bands of tissue (ligaments) that surround the AC bones and joints. These bands of tissue can tear, which can lead to sprains and separations. The bones of the AC joint can also break (fracture).  °HOME CARE  °· Put ice on the injured area. °¨ Put ice in a plastic bag. °¨ Place a towel between your skin and the bag. °¨ Leave the ice on for 15-20 minutes, 03-04 times a day. °· Wear your sling as told by your doctor. Remove the sling before showering and bathing. Keep the shoulder in the same place as when the sling is on. Do not lift the arm. °· Gently tighten your figure-eight splint (if applied) every day. Tighten it enough to keep the shoulders held back. There should be room to place your finger between your body and the strap. Loosen the splint right away if you lose feeling (numbness) or have tingling in your hands. °· Only take medicine as told by your doctor. °· Keep all follow-up visits with your doctor. °GET HELP RIGHT AWAY IF:  °· Your medicine does not help your pain. °· You have more puffiness (swelling) or your bruising gets worse rather than better. °· You were unable to follow up as told by your doctor. °· You have tingling or lose even more feeling in your arm, forearm, or hand. °· Your arm is cold or pale. °· You have more pain in the hand, forearm, or fingers. °MAKE SURE YOU:  °· Understand these instructions. °· Will watch your condition. °· Will get help right away if you are not doing well or get worse. °Document Released: 05/31/2010 Document Revised: 03/04/2012 Document Reviewed: 05/31/2010 °ExitCare® Patient Information ©2015 [employer]. This information is not intended to replace advice given to you by your health care provider. Make sure you discuss any questions you have with your health care provider. ° °

## 2015-02-02 NOTE — ED Notes (Signed)
Pt reports faling down the steps last night. Pt with LOC, abrasion noted to L eyelid. Pt c/o L shoulder pain. Pt denies dizziness or vomiting.

## 2015-02-04 ENCOUNTER — Encounter: Payer: Self-pay | Admitting: Orthopedic Surgery

## 2015-02-04 ENCOUNTER — Ambulatory Visit (INDEPENDENT_AMBULATORY_CARE_PROVIDER_SITE_OTHER): Payer: BLUE CROSS/BLUE SHIELD | Admitting: Orthopedic Surgery

## 2015-02-04 VITALS — BP 139/91 | Ht 71.0 in | Wt 192.0 lb

## 2015-02-04 DIAGNOSIS — S43109A Unspecified dislocation of unspecified acromioclavicular joint, initial encounter: Secondary | ICD-10-CM | POA: Insufficient documentation

## 2015-02-04 DIAGNOSIS — S43102A Unspecified dislocation of left acromioclavicular joint, initial encounter: Secondary | ICD-10-CM

## 2015-02-04 MED ORDER — OXYCODONE-ACETAMINOPHEN 5-325 MG PO TABS: 1.0000 | ORAL_TABLET | ORAL | Status: DC | PRN

## 2015-02-04 NOTE — Patient Instructions (Signed)
Out of work 4 weeks  Sling for 3 weeks

## 2015-02-04 NOTE — Progress Notes (Signed)
Patient ID: Philip Hughes, male   DOB: 09-06-1986, 29 y.o.  Philip Hare MRN: [id number]  Chief Complaint  Patient presents with  . Follow-up    er follow up Left shoulder AC separation, DOI 02/01/15    HPI Philip Hughes is a 29 y.o. male.  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 In the ER he reported loss of consciousness 2-3 minutes and stated that he did have some alcohol the night before his injury. He now complains of constant pain over the left before meals joint which is dull moderate to severe and associated with range of motion in the shoulder HPI  Review of Systems Review of Systems Knee other symptoms in the musculoskeletal system.  Past Medical History  Diagnosis Date  . Migraines     Past Surgical History  Procedure Laterality Date  . Tonsillectomy      Family History  Problem Relation Age of Onset  . Diabetes Mother   . Asthma Mother   . Cancer Other   . Hypertension Other     Social History History  Substance Use Topics  . Smoking status: Former Smoker -- 1.00 packs/day    Types: Cigarettes    Quit date: 05/08/2014  . Smokeless tobacco: Never Used  . Alcohol Use: Yes    No Known Allergies  Current Outpatient Prescriptions  Medication Sig Dispense Refill  . oxyCODONE-acetaminophen (PERCOCET/ROXICET) 5-325 MG per tablet Take 1 tablet by mouth every 4 (four) hours as needed for severe pain. 84 tablet 0   No current facility-administered medications for this visit.       Physical Exam Blood pressure 139/91, height 5\' 11"  (1.803 m), weight 192 lb (87.091 kg). Physical Exam Normal appearance grooming hygiene oriented 3 mood and affect normal vital signs stable monitor status normal Swelling of the left shoulder with tenderness at the before meals joint painful  passive range of motion in all planes. Stability tests could not be performed but the elbow and wrist were stable and no joint subluxation or reduction needed. Motor exam muscle tone normal skin intact no skin abrasions. Good distal pulse normal sensation left hand   Data Reviewed X-ray of the hospital shows slight elevation of the distal clavicle relation to the acromion consistent with type II separation  Assessment    Encounter Diagnosis  Name Primary?  . Acromioclavicular joint separation, type 2, left, initial encounter Yes        Plan    Sling for 3 weeks. Percocet for 2 weeks and change over to [employer]. Out of work 4 weeks. Follow-up 4 weeks.

## 2015-03-04 ENCOUNTER — Encounter: Payer: Self-pay | Admitting: Orthopedic Surgery

## 2015-03-04 ENCOUNTER — Ambulatory Visit (INDEPENDENT_AMBULATORY_CARE_PROVIDER_SITE_OTHER): Payer: BLUE CROSS/BLUE SHIELD | Admitting: Orthopedic Surgery

## 2015-03-04 VITALS — BP 116/77 | Ht 71.0 in | Wt 192.0 lb

## 2015-03-04 DIAGNOSIS — S43101D Unspecified dislocation of right acromioclavicular joint, subsequent encounter: Secondary | ICD-10-CM

## 2015-03-04 NOTE — Progress Notes (Signed)
Chief Complaint  Patient presents with  . Follow-up    4 week recheck left shoulder s/p sling, DOI 02/01/15    Left before meals joint separation. Doing well. Were sling for 4 weeks.  Now has full range of motion. No numbness or tingling on review of systems. He does have some pain when he reaches far far behind them in extension but he thinks he is ready to go back to work  His exam shows no tenderness around the shoulder joint or before meals joint no swelling. He has regained full range of motion his shoulder is stable motor exam is normal to manual muscle testing for the rotator cuff is skin is intact his pulses are good sensation is normal he has no lymph nodes in the axilla  Impression healed before meals separation may return to work on Monday

## 2015-03-04 NOTE — Patient Instructions (Addendum)
Home exercises for 4 weeks   RTW ON Monday 03/08/15

## 2015-03-18 ENCOUNTER — Telehealth: Payer: Self-pay | Admitting: Orthopedic Surgery

## 2015-03-18 NOTE — Telephone Encounter (Signed)
Notes(for purpose of update to claim, regarding date of visit 03/04/15, faxed to patient's short-term disability payor, Monia Pouchetna at [employer][phone]; authorization on file.

## 2015-03-28 ENCOUNTER — Encounter (HOSPITAL_COMMUNITY): Payer: Self-pay

## 2015-03-28 ENCOUNTER — Emergency Department (HOSPITAL_COMMUNITY)
Admission: EM | Admit: 2015-03-28 | Discharge: 2015-03-28 | Disposition: A | Discharge status: Home / Self Care | Payer: BLUE CROSS/BLUE SHIELD | Attending: Emergency Medicine | Admitting: Emergency Medicine

## 2015-03-28 DIAGNOSIS — Z8679 Personal history of other diseases of the circulatory system: Secondary | ICD-10-CM | POA: Diagnosis not present

## 2015-03-28 DIAGNOSIS — J029 Acute pharyngitis, unspecified: Secondary | ICD-10-CM

## 2015-03-28 DIAGNOSIS — Z720 Tobacco use: Secondary | ICD-10-CM | POA: Diagnosis not present

## 2015-03-28 MED ORDER — IBUPROFEN 800 MG PO TABS
800.0000 mg | ORAL_TABLET | Freq: Once | ORAL | Status: AC
  Administered 2015-03-28: 800 mg via ORAL
  Filled 2015-03-28: qty 1

## 2015-03-28 MED ORDER — AMOXICILLIN 250 MG PO CAPS
500.0000 mg | ORAL_CAPSULE | Freq: Once | ORAL | Status: AC
  Administered 2015-03-28: 500 mg via ORAL
  Filled 2015-03-28: qty 2

## 2015-03-28 MED ORDER — IBUPROFEN 800 MG PO TABS: 800.0000 mg | ORAL_TABLET | Freq: Three times a day (TID) | ORAL | Status: DC

## 2015-03-28 MED ORDER — AMOXICILLIN 500 MG PO CAPS: 500.0000 mg | ORAL_CAPSULE | Freq: Three times a day (TID) | ORAL | Status: DC

## 2015-03-28 NOTE — ED Notes (Signed)
Patient states sore throat started yesterday, and got worse today. Patient states body aches and fever today. Patient states he took tylenol at [id number]

## 2015-03-28 NOTE — Discharge Instructions (Signed)
Saltwater gargles maybe helpful. Please use Amoxil and ibuprofen 3 times daily. Please use a mask until symptoms have resolved. Please wash hands frequently. Pharyngitis Pharyngitis is a sore throat (pharynx). There is redness, pain, and swelling of your throat. HOME CARE   Drink enough fluids to keep your pee (urine) clear or pale yellow.  Only take medicine as told by your doctor.  You may get sick again if you do not take medicine as told. Finish your medicines, even if you start to feel better.  Do not take aspirin.  Rest.  Rinse your mouth (gargle) with salt water ( tsp of salt per 1 qt of water) every 1-2 hours. This will help the pain.  If you are not at risk for choking, you can suck on hard candy or sore throat lozenges. GET HELP IF:  You have large, tender lumps on your neck.  You have a rash.  You cough up green, yellow-brown, or bloody spit. GET HELP RIGHT AWAY IF:   You have a stiff neck.  You drool or cannot swallow liquids.  You throw up (vomit) or are not able to keep medicine or liquids down.  You have very bad pain that does not go away with medicine.  You have problems breathing (not from a stuffy nose). MAKE SURE YOU:   Understand these instructions.  Will watch your condition.  Will get help right away if you are not doing well or get worse. Document Released: 05/29/2008 Document Revised: 10/01/2013 Document Reviewed: 08/18/2013 [facility] Patient Information 2015 [employer] [place]. This information is not intended to replace advice given to you by your health care provider. Make sure you discuss any questions you have with your health care provider.

## 2015-03-28 NOTE — ED Provider Notes (Signed)
CSN: [id number]     Arrival date & time 03/28/15  1856 History  This chart was scribed for non-physician practitioner Ivery QualeHobson Yaritsa Savarino, PA-C working with Mancel BaleElliott Wentz, MD by Murriel HopperAlec Bankhead, ED Scribe. This patient was seen in room APFT23/APFT23 and the patient's care was started at 7:54 PM.    Chief Complaint  Patient presents with  . Sore Throat   The history is provided by the patient. No language interpreter was used.     HPI Comments: Philip Hughes is a 29 y.o. male who presents to the Emergency Department complaining of a worsening, constant sore throat with associated fever and generalized body aches that has been present since yesterday. Pt states that yesterday he began to have a scratchy throat and then this morning woke up and had a sore throat and body aches. Pt states that he went to work this morning but then had to go home after two hours because of his symptoms. Pt states he slept all day then woke up around 1800 and took his temperature, which he states was 102.7. Pt denies vomiting and diarrhea.    Past Medical History  Diagnosis Date  . Migraines    Past Surgical History  Procedure Laterality Date  . Tonsillectomy     Family History  Problem Relation Age of Onset  . Diabetes Mother   . Asthma Mother   . Cancer Other   . Hypertension Other    History  Substance Use Topics  . Smoking status: Current Every Day Smoker -- 1.00 packs/day    Types: Cigarettes    Last Attempt to Quit: 05/08/2014  . Smokeless tobacco: Never Used  . Alcohol Use: Yes     Comment: occasionally    Review of Systems  Constitutional: Positive for fever.  HENT: Positive for sore throat.   Gastrointestinal: Negative for vomiting and diarrhea.      Allergies  Review of patient's allergies indicates no known allergies.  Home Medications   Prior to Admission medications   Medication Sig Start Date End Date Taking? Authorizing Provider  oxyCODONE-acetaminophen (PERCOCET/ROXICET) 5-325  MG per tablet Take 1 tablet by mouth every 4 (four) hours as needed for severe pain. 02/04/15   Vickki HearingStanley E Harrison, MD   BP 123/70 mmHg  Pulse 105  Temp(Src) 98.7 F (37.1 C) (Oral)  Resp 18  Ht 5\' 11"  (1.803 m)  Wt 198 lb (89.812 kg)  BMI 27.63 kg/m2  SpO2 99% Physical Exam  Constitutional: He is oriented to person, place, and time. He appears well-developed and well-nourished.  HENT:  Head: Normocephalic and atraumatic.  increased redness of posterior pharynx Uvula midline but swollen Airway is patent  Eyes: Conjunctivae and EOM are normal. Pupils are equal, round, and reactive to light.  Cardiovascular: Normal rate.   Pulmonary/Chest: Effort normal.  Abdominal: He exhibits no distension.  Lymphadenopathy:    He has no cervical adenopathy.  Neurological: He is alert and oriented to person, place, and time.  Skin: Skin is warm and dry.  Psychiatric: He has a normal mood and affect.  Nursing note and vitals reviewed.   ED Course  Procedures (including critical care time)  DIAGNOSTIC STUDIES: Oxygen Saturation is 99% on room air, normal by my interpretation.    COORDINATION OF CARE: 7:58 PM Discussed treatment plan with pt at bedside and pt agreed to plan.   Labs Review Labs Reviewed - No data to display  Imaging Review No results found.   EKG Interpretation None  MDM Exam  And hx favor pharyngitis and URI. No abscess noted. Airway patent. Pt will be treated with amoxil and ibuprofen. Pt to return to ED or see PCP if not improving.    Final diagnoses:  None    *I have reviewed nursing notes, vital signs, and all appropriate lab and imaging results for this patient.**  **I personally performed the services described in this documentation, which was scribed in my presence. The recorded information has been reviewed and is accurate.Ivery Quale, PA-C 03/29/15 1628  Mancel Bale, MD 03/31/15 [phone]

## 2015-05-05 ENCOUNTER — Ambulatory Visit (INDEPENDENT_AMBULATORY_CARE_PROVIDER_SITE_OTHER): Payer: BLUE CROSS/BLUE SHIELD | Admitting: Family Medicine

## 2015-05-05 ENCOUNTER — Encounter: Payer: Self-pay | Admitting: Family Medicine

## 2015-05-05 VITALS — BP 110/78 | Temp 98.2°F | Ht 71.0 in | Wt 200.2 lb

## 2015-05-05 DIAGNOSIS — R21 Rash and other nonspecific skin eruption: Secondary | ICD-10-CM | POA: Diagnosis not present

## 2015-05-05 MED ORDER — DOXYCYCLINE HYCLATE 100 MG PO TABS: 100.0000 mg | ORAL_TABLET | Freq: Two times a day (BID) | ORAL | Status: DC

## 2015-05-05 NOTE — Progress Notes (Signed)
   Subjective:    Patient ID: Philip HareLance A Angell, male    DOB: 09-05-1986, 29 y.o.   MRN: [id number]  Rash This is a new problem. The current episode started more than 1 month ago. The problem is unchanged. The affected locations include the back. Rash characteristics: Raised. He was exposed to nothing. Past treatments include nothing. The treatment provided no relief.   See below   Review of Systems  Skin: Positive for rash.   no vomiting no diarrhea     Objective:   Physical Exam  Alert vitals stable lungs clear. Heart regular in rhythm. Folliculitis ruptured evident upper back and left calf      Assessment & Plan:  Patient unfortunately smoking rare cough no significant shortness of breath  Impression folliculitis discussed #2 chronic smoking discussed plan doxycycline twice a day 10 days. Smoking cessation encourage WSL

## 2015-05-05 NOTE — Patient Instructions (Signed)
This is called folliculitis, a mild infection of individual oil glands, the med should help it fade, definitely not skin cancer

## 2015-05-15 ENCOUNTER — Emergency Department (HOSPITAL_COMMUNITY)
Admission: EM | Admit: 2015-05-15 | Discharge: 2015-05-15 | Disposition: A | Discharge status: Home / Self Care | Payer: BLUE CROSS/BLUE SHIELD | Attending: Emergency Medicine | Admitting: Emergency Medicine

## 2015-05-15 ENCOUNTER — Emergency Department (HOSPITAL_COMMUNITY): Payer: BLUE CROSS/BLUE SHIELD

## 2015-05-15 ENCOUNTER — Encounter (HOSPITAL_COMMUNITY): Payer: Self-pay | Admitting: *Deleted

## 2015-05-15 DIAGNOSIS — Y998 Other external cause status: Secondary | ICD-10-CM | POA: Diagnosis not present

## 2015-05-15 DIAGNOSIS — S0083XA Contusion of other part of head, initial encounter: Secondary | ICD-10-CM

## 2015-05-15 DIAGNOSIS — Y9289 Other specified places as the place of occurrence of the external cause: Secondary | ICD-10-CM | POA: Insufficient documentation

## 2015-05-15 DIAGNOSIS — Z8679 Personal history of other diseases of the circulatory system: Secondary | ICD-10-CM | POA: Diagnosis not present

## 2015-05-15 DIAGNOSIS — W548XXA Other contact with dog, initial encounter: Secondary | ICD-10-CM | POA: Insufficient documentation

## 2015-05-15 DIAGNOSIS — Z792 Long term (current) use of antibiotics: Secondary | ICD-10-CM | POA: Insufficient documentation

## 2015-05-15 DIAGNOSIS — S72492A Other fracture of lower end of left femur, initial encounter for closed fracture: Secondary | ICD-10-CM | POA: Diagnosis not present

## 2015-05-15 DIAGNOSIS — Y9389 Activity, other specified: Secondary | ICD-10-CM | POA: Insufficient documentation

## 2015-05-15 DIAGNOSIS — S8992XA Unspecified injury of left lower leg, initial encounter: Secondary | ICD-10-CM | POA: Diagnosis present

## 2015-05-15 DIAGNOSIS — Z720 Tobacco use: Secondary | ICD-10-CM | POA: Insufficient documentation

## 2015-05-15 DIAGNOSIS — S72102A Unspecified trochanteric fracture of left femur, initial encounter for closed fracture: Secondary | ICD-10-CM

## 2015-05-15 MED ORDER — KETOROLAC TROMETHAMINE 10 MG PO TABS
10.0000 mg | ORAL_TABLET | Freq: Once | ORAL | Status: AC
  Administered 2015-05-15: 10 mg via ORAL
  Filled 2015-05-15: qty 1

## 2015-05-15 MED ORDER — ACETAMINOPHEN 325 MG PO TABS
650.0000 mg | ORAL_TABLET | Freq: Once | ORAL | Status: AC
  Administered 2015-05-15: 650 mg via ORAL
  Filled 2015-05-15: qty 2

## 2015-05-15 MED ORDER — HYDROCODONE-ACETAMINOPHEN 7.5-325 MG PO TABS: 1.0000 | ORAL_TABLET | ORAL | Status: DC | PRN

## 2015-05-15 MED ORDER — IBUPROFEN 800 MG PO TABS: 800.0000 mg | ORAL_TABLET | Freq: Three times a day (TID) | ORAL | Status: DC

## 2015-05-15 NOTE — ED Notes (Signed)
Left knee pain. Pt states he tripped over a dog fence and landed face first on wooden deck.

## 2015-05-15 NOTE — ED Provider Notes (Signed)
CSN: [id number]     Arrival date & time 05/15/15  1718 History   First MD Initiated Contact with Patient 05/15/15 1833     Chief Complaint  Patient presents with  . Knee Pain     (Consider location/radiation/quality/duration/timing/severity/associated sxs/prior Treatment) HPI Comments: Patient is a 29 year old male who presents to the emergency department with injury to the left knee and to the left face.  The patient states that he tripped over a dog fence, and landed face first on a wooden deck. He injured his left knee. This accident occurred about 7:30 in the morning. The patient attempted to rest the knee, but about noon he noticed that it was swelling more and beginning to throb. He presented to the emergency department for evaluation. The patient also sustained a bruise to the left eye and side of the face. He denies any vision changes, he denies any loss of consciousness. The patient states he is not on any anticoagulation medications, and denies any bleeding type disorders. He's not had any recent surgeries involving the left face or the left knee.  Patient is a 29 y.o. male presenting with knee pain. The history is provided by the patient.  Knee Pain   Past Medical History  Diagnosis Date  . Migraines    Past Surgical History  Procedure Laterality Date  . Tonsillectomy     Family History  Problem Relation Age of Onset  . Diabetes Mother   . Asthma Mother   . Cancer Other   . Hypertension Other    History  Substance Use Topics  . Smoking status: Current Every Day Smoker -- 1.00 packs/day    Types: Cigarettes    Last Attempt to Quit: 05/08/2014  . Smokeless tobacco: Never Used  . Alcohol Use: Yes     Comment: occasionally    Review of Systems  Musculoskeletal: Positive for arthralgias.  Neurological: Positive for headaches.  All other systems reviewed and are negative.     Allergies  Review of patient's allergies indicates no known allergies.  Home  Medications   Prior to Admission medications   Medication Sig Start Date End Date Taking? Authorizing Provider  doxycycline (VIBRA-TABS) 100 MG tablet Take 1 tablet (100 mg total) by mouth 2 (two) times daily. 05/05/15   Merlyn AlbertWilliam S Luking, MD   BP 118/71 mmHg  Pulse 106  Temp(Src) 98.2 F (36.8 C) (Oral)  Resp 12  Ht 5\' 11"  (1.803 m)  Wt 205 lb (92.987 kg)  BMI 28.60 kg/m2  SpO2 100% Physical Exam  Constitutional: He is oriented to person, place, and time. He appears well-developed and well-nourished.  Non-toxic appearance.  HENT:  Head: Normocephalic.  Right Ear: Tympanic membrane and external ear normal.  Left Ear: Tympanic membrane and external ear normal.  There is bruising about the left lower orbit and the left upper lid of the left eye. The anterior chamber is clear. The conjunctiva is clear. The x-ray movements are intact.  Eyes: EOM and lids are normal. Pupils are equal, round, and reactive to light.  Neck: Normal range of motion. Neck supple. Carotid bruit is not present.  Cardiovascular: Normal rate, regular rhythm, normal heart sounds, intact distal pulses and normal pulses.   Pulmonary/Chest: Breath sounds normal. No respiratory distress.  Abdominal: Soft. Bowel sounds are normal. There is no tenderness. There is no guarding.  Musculoskeletal: Normal range of motion.  There is good range of motion of the left hip. There is no effusion of the left knee.  There is pain of the lateral left knee. There is soreness of the posterior knee, no mass appreciated. The anterior tibial tuberosity and patellar tendon is intact. Motion of the left ankle. The Achilles tendon is intact. The dorsalis pedis and posterior tibial pulses are 2+.  Lymphadenopathy:       Head (right side): No submandibular adenopathy present.       Head (left side): No submandibular adenopathy present.    He has no cervical adenopathy.  Neurological: He is alert and oriented to person, place, and time. He has  normal strength. No cranial nerve deficit or sensory deficit.  Skin: Skin is warm and dry.  Psychiatric: He has a normal mood and affect. His speech is normal.  Nursing note and vitals reviewed.   ED Course  Procedures (including critical care time) Labs Review Labs Reviewed - No data to display  Imaging Review Dg Knee Complete 4 Views Left  05/15/2015   CLINICAL DATA:  Left knee pain, fall today. Pain and swelling anteriorly  EXAM: LEFT KNEE - COMPLETE 4+ VIEW  COMPARISON:  None.  FINDINGS: There is of large joint effusion. Anterior soft tissue swelling noted. There is a small linear bone density noted on the oblique view which may represent a small avulsed fragment from the distal femur. This is adjacent to the lateral femoral condyle.  IMPRESSION: Large joint effusion. Possible small avulsed fragment most likely from the lateral femoral condyle. Further evaluation with MRI may be helpful to assess for internal derangement.   Electronically Signed   By: Charlett Nose M.D.   On: 05/15/2015 18:12     EKG Interpretation None      MDM  The pulse rate is elevated at 106, otherwise vital signs are well within normal limits. The pulse oximetry is 100% on room air. Within normal limits by my interpretation. The x-ray of the left knee shows a large joint effusion present. There is a possible avulsion fragment present. It is felt this is most likely from the lateral femoral condyle.  I discussed the findings with the patient. I discussed the examination with the patient in terms which he understands. The patient is fitted with a knee immobilizer, ice pack, and crutches. Prescription for Norco 7.5 mg and ibuprofen given to the patient. The patient will follow-up with Dr. Romeo Apple next week. We have discussed ice, elevation, using her crutches, and also returning if any vision changes or changes about the bruise of the face.    Final diagnoses:  None    *I have reviewed nursing notes, vital  signs, and all appropriate lab and imaging results for this patient.**    Ivery Quale, PA-C 05/15/15 1933  Glynn Octave, MD 05/15/15 [phone]

## 2015-05-15 NOTE — ED Notes (Signed)
Pt verbalized understanding of no driving and to use caution within 4 hours of taking pain meds due to meds cause drowsiness 

## 2015-05-15 NOTE — Discharge Instructions (Signed)
You have a small avulsion fracture of the left femur. Please use the knee immobilizer when up and about. Use crutches until seen by Dr. Romeo AppleHarrison. Please see Dr. Romeo AppleHarrison as soon as possible concerning your avulsion fracture. Please keep your knee elevated above your waist. Please apply ice. Use ibuprofen 3 times daily with food. May use Norco for more severe pain. Norco may cause drowsiness, please use caution with this medication.

## 2015-05-18 ENCOUNTER — Encounter: Payer: Self-pay | Admitting: Orthopedic Surgery

## 2015-05-18 ENCOUNTER — Ambulatory Visit (INDEPENDENT_AMBULATORY_CARE_PROVIDER_SITE_OTHER): Payer: BLUE CROSS/BLUE SHIELD | Admitting: Orthopedic Surgery

## 2015-05-18 VITALS — BP 128/86 | Ht 71.0 in | Wt 205.0 lb

## 2015-05-18 DIAGNOSIS — S83512A Sprain of anterior cruciate ligament of left knee, initial encounter: Secondary | ICD-10-CM

## 2015-05-18 MED ORDER — HYDROCODONE-ACETAMINOPHEN 7.5-325 MG PO TABS: 1.0000 | ORAL_TABLET | ORAL | Status: DC | PRN

## 2015-05-18 MED ORDER — IBUPROFEN 800 MG PO TABS: 800.0000 mg | ORAL_TABLET | Freq: Three times a day (TID) | ORAL | Status: DC

## 2015-05-18 NOTE — Progress Notes (Signed)
Patient ID: Philip Hughes, male   DOB: 07-16-1986, 29 y.o.   MRN: [id number]  Chief Complaint  Patient presents with  . Knee Pain    left femur fracture, DOI 05/15/15    History of present illness. 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 He was carrying groceries tripped over her dog fence landed violently on his left side including his left knee and sustained a left knee injury a contusion over the left eye socket. He presented to the ER on the 21st his x-rays showed a possible lateral capsular avulsion from the femur. He presents with pain swelling loss of motion and painful weightbearing in his left knee.  Review of systems is normal 14.  Past Medical History  Diagnosis Date  . Migraines    Past Surgical History  Procedure Laterality Date  . Tonsillectomy      Detailed exam BP 128/86 mmHg  Ht 5\' 11"  (1.803 m)  Wt 205 lb (92.987 kg)  BMI 28.60 kg/m2 He is awake alert and oriented 3 has a pleasant mood and affect his appearance is normal he has no gross deformities other than the large joint effusion in the left knee. He is ambulatory with a knee brace and crutches touchdown weightbearing  His right knee looks normal. No swelling. Normal motion stability and strength scans normal pulses good sensation normal  Left knee large joint effusion painful range of motion from 0-80. Could not assess his instability because of pain and swelling. Muscle tone was normal he was able to perform terminal knee extension skin was intact had a good distal pulse compartments were soft sensation was normal  I evaluated his x-rays and he has a fragment which appears to be coming from lateral femoral condyle  Assessment  probable anterior cruciate ligament tear versus  patellar dislocation versus nondisplaced  tibial plateau fracture   Plan continue ibuprofen and Norco 7.5 mg continue bracing and crutches  MRI left knee

## 2015-05-18 NOTE — Patient Instructions (Signed)
We will schedule MRI for you and call you with results 

## 2015-06-01 ENCOUNTER — Ambulatory Visit (HOSPITAL_COMMUNITY)
Admission: RE | Admit: 2015-06-01 | Discharge: 2015-06-01 | Disposition: A | Discharge status: Home / Self Care | Payer: BLUE CROSS/BLUE SHIELD | Source: Ambulatory Visit | Attending: Orthopedic Surgery | Admitting: Orthopedic Surgery

## 2015-06-01 ENCOUNTER — Telehealth: Payer: Self-pay | Admitting: Orthopedic Surgery

## 2015-06-01 DIAGNOSIS — Y92017 Garden or yard in single-family (private) house as the place of occurrence of the external cause: Secondary | ICD-10-CM | POA: Insufficient documentation

## 2015-06-01 DIAGNOSIS — W19XXXA Unspecified fall, initial encounter: Secondary | ICD-10-CM | POA: Diagnosis not present

## 2015-06-01 DIAGNOSIS — S82002A Unspecified fracture of left patella, initial encounter for closed fracture: Secondary | ICD-10-CM | POA: Diagnosis not present

## 2015-06-01 DIAGNOSIS — M25462 Effusion, left knee: Secondary | ICD-10-CM | POA: Diagnosis not present

## 2015-06-01 DIAGNOSIS — M25562 Pain in left knee: Secondary | ICD-10-CM | POA: Insufficient documentation

## 2015-06-01 DIAGNOSIS — S83512A Sprain of anterior cruciate ligament of left knee, initial encounter: Secondary | ICD-10-CM

## 2015-06-02 ENCOUNTER — Other Ambulatory Visit: Payer: Self-pay | Admitting: *Deleted

## 2015-06-02 ENCOUNTER — Telehealth: Payer: Self-pay | Admitting: Orthopedic Surgery

## 2015-06-02 NOTE — Telephone Encounter (Signed)
As I discussed with the patient he will need arthroscopic surgery for evaluation of the joint and removal of loose fragment and possible repair of medial retinaculum to the patella

## 2015-06-02 NOTE — Telephone Encounter (Signed)
Cancel note

## 2015-06-03 NOTE — Telephone Encounter (Signed)
cancel

## 2015-06-04 NOTE — Patient Instructions (Signed)
Philip Hughes  06/04/2015      Your procedure is scheduled on 06/11/15.  Report to [facility] at 09:10 A.M.  Call this number if you have problems the morning of surgery:  [phone]   Remember:  Do not eat food or drink liquids after midnight.  Take these medicines the morning of surgery with A SIP OF WATER: Hydrocodone if needed   Do not wear jewelry, make-up or nail polish.  Do not wear lotions, powders, or perfumes.  You may wear deodorant.  Do not shave 48 hours prior to surgery.  Men may shave face and neck.  Do not bring valuables to the hospital.  [facility] is not responsible for any belongings or valuables.  Contacts, dentures or bridgework may not be worn into surgery.  Leave your suitcase in the car.  After surgery it may be brought to your room.  For patients admitted to the hospital, discharge time will be determined by your treatment team.  Patients discharged the day of surgery will not be allowed to drive home.   Special instructions:  Shower using Hibiclens (CHG bath) the night before surgery and the morning of surgery.   Please read over the following fact sheets that you were given. Anesthesia Post-op Instructions    Arthroscopic Procedure, Knee An arthroscopic procedure can find what is wrong with your knee. PROCEDURE Arthroscopy is a surgical technique that allows your orthopedic surgeon to diagnose and treat your knee injury with accuracy. They will look into your knee through a small instrument. This is almost like a small (pencil sized) telescope. Because arthroscopy affects your knee less than open knee surgery, you can anticipate a more rapid recovery. Taking an active role by following your caregiver's instructions will help with rapid and complete recovery. Use crutches, rest, elevation, ice, and knee exercises as instructed. The length of recovery depends on various factors including type of injury, age, physical condition, medical conditions,  and your rehabilitation. Your knee is the joint between the large bones (femur and tibia) in your leg. Cartilage covers these bone ends which are smooth and slippery and allow your knee to bend and move smoothly. Two menisci, thick, semi-lunar shaped pads of cartilage which form a rim inside the joint, help absorb shock and stabilize your knee. Ligaments bind the bones together and support your knee joint. Muscles move the joint, help support your knee, and take stress off the joint itself. Because of this all programs and physical therapy to rehabilitate an injured or repaired knee require rebuilding and strengthening your muscles. AFTER THE PROCEDURE  After the procedure, you will be moved to a recovery area until most of the effects of the medication have worn off. Your caregiver will discuss the test results with you.  Only take over-the-counter or prescription medicines for pain, discomfort, or fever as directed by your caregiver. SEEK MEDICAL CARE IF:   You have increased bleeding from your wounds.  You see redness, swelling, or have increasing pain in your wounds.  You have pus coming from your wound.  You have an oral temperature above 102 F (38.9 C).  You notice a bad smell coming from the wound or dressing.  You have severe pain with any motion of your knee. SEEK IMMEDIATE MEDICAL CARE IF:   You develop a rash.  You have difficulty breathing.  You have any allergic problems. Document Released: 12/08/2000 Document Revised: 03/04/2012 Document Reviewed: 07/01/2008 [facility] Patient Information 2015 [place], [place]. This information  is not intended to replace advice given to you by your health care provider. Make sure you discuss any questions you have with your health care provider.    PATIENT INSTRUCTIONS POST-ANESTHESIA  IMMEDIATELY FOLLOWING SURGERY:  Do not drive or operate machinery for the first twenty four hours after surgery.  Do not make any important decisions  for twenty four hours after surgery or while taking narcotic pain medications or sedatives.  If you develop intractable nausea and vomiting or a severe headache please notify your doctor immediately.  FOLLOW-UP:  Please make an appointment with your surgeon as instructed. You do not need to follow up with anesthesia unless specifically instructed to do so.  WOUND CARE INSTRUCTIONS (if applicable):  Keep a dry clean dressing on the anesthesia/puncture wound site if there is drainage.  Once the wound has quit draining you may leave it open to air.  Generally you should leave the bandage intact for twenty four hours unless there is drainage.  If the epidural site drains for more than 36-48 hours please call the anesthesia department.  QUESTIONS?:  Please feel free to call your physician or the hospital operator if you have any questions, and they will be happy to assist you.

## 2015-06-07 ENCOUNTER — Telehealth: Payer: Self-pay | Admitting: Orthopedic Surgery

## 2015-06-07 ENCOUNTER — Encounter (HOSPITAL_COMMUNITY): Payer: Self-pay

## 2015-06-07 ENCOUNTER — Encounter (HOSPITAL_COMMUNITY)
Admission: RE | Admit: 2015-06-07 | Discharge: 2015-06-07 | Disposition: A | Discharge status: Home / Self Care | Payer: BLUE CROSS/BLUE SHIELD | Source: Ambulatory Visit | Attending: Orthopedic Surgery | Admitting: Orthopedic Surgery

## 2015-06-07 DIAGNOSIS — Z01818 Encounter for other preprocedural examination: Secondary | ICD-10-CM | POA: Insufficient documentation

## 2015-06-07 HISTORY — DX: Personal history of infections of the central nervous system: Z86.61

## 2015-06-07 LAB — BASIC METABOLIC PANEL
Anion gap: 8 (ref 5–15)
BUN: 15 mg/dL (ref 6–20)
CO2: 26 mmol/L (ref 22–32)
CREATININE: 0.88 mg/dL (ref 0.61–1.24)
Calcium: 8.7 mg/dL — ABNORMAL LOW (ref 8.9–10.3)
Chloride: 103 mmol/L (ref 101–111)
GFR calc Af Amer: >60 mL/min (ref >60)
GFR calc non Af Amer: >60 mL/min (ref >60)
Glucose, Bld: 77 mg/dL (ref 65–99)
Potassium: 4.2 mmol/L (ref 3.5–5.1)
SODIUM: 137 mmol/L (ref 135–145)

## 2015-06-07 LAB — CBC
HEMATOCRIT: 44 % (ref 39.0–52.0)
Hemoglobin: 15.4 g/dL (ref 13.0–17.0)
MCH: 31.6 pg (ref 26.0–34.0)
MCHC: 35 g/dL (ref 30.0–36.0)
MCV: 90.2 fL (ref 78.0–100.0)
Platelets: 234 10*3/uL (ref 150–400)
RBC: 4.88 MIL/uL (ref 4.22–5.81)
RDW: 12.8 % (ref 11.5–15.5)
WBC: 7.3 10*3/uL (ref 4.0–10.5)

## 2015-06-07 MED ORDER — CHLORHEXIDINE GLUCONATE 4 % EX LIQD: 60.0000 mL | Freq: Once | CUTANEOUS | Status: DC

## 2015-06-07 NOTE — Telephone Encounter (Signed)
Regarding out-patient surgery scheduled 06/11/15, [facility], contacted insurer [place], [id number], [phone]; procedure may include additional CPT's at time of surgery.  Per Barkley Bruns,  No pre-authorization required for out-patient procedures; her name and today's date, 06/07/15, 4:57p.m.

## 2015-06-10 NOTE — H&P (Signed)
  Patient ID: Philip Hughes, male   DOB: 1986-09-12, 29 y.o.   MRN: [id number]    Chief Complaint   Patient presents with   .  Knee Pain       left femur fracture, DOI 05/15/15     History of present illness. 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 He presented to the ER on the 21st his x-rays showed a possible lateral capsular avulsion from the femur. He presents with pain swelling loss of motion and painful weightbearing in his left knee.  Review of systems is normal 14.    Past Medical History   Diagnosis  Date   .  Migraines      Past Surgical History   Procedure  Laterality  Date   .  Tonsillectomy        Family History  Problem Relation Age of Onset  . Diabetes Mother   . Asthma Mother   . Cancer Other   . Hypertension Other     Detailed exam BP 128/86 mmHg  Ht 5\' 11"  (1.803 m)  Wt 205 lb (92.987 kg)  BMI 28.60 kg/m2 He is awake alert and oriented 3 has a pleasant mood and affect his appearance is normal he has no gross deformities other than the large joint effusion in the left knee. He is ambulatory with a knee brace and crutches touchdown weightbearing  His right knee looks normal. No swelling. Normal motion stability and strength scans normal pulses good sensation normal  Left knee large joint effusion painful range of motion from 0-80. Could not assess his instability because of pain and swelling. Muscle tone was normal he was able to perform terminal knee extension skin was intact had a good distal pulse compartments were soft sensation was normal  I evaluated his x-rays and he has a fragment which appears to be coming from lateral femoral condyle  Assessment   Patella dislocation left knee, medial patellofemoral ligament injury, fracture lateral femoral condyle, possible  loose body  Arthroscopy left knee removal of loose body. (Assessment of medial patellofemoral ligament for possible repair).    MRI left knee Extensor Mechanism: Intact quadriceps tendon and patellar tendon. Increased signal and attenuation of the patellar insertion of the MPFL most concerning for injury. There is no stripping of the vastus medialis obliquus. There is mild edema in the vastus lateralis and vastus medialis muscles.   Bones: Severe osseous contusion of the anterior peripheral lateral femoral condyle with small mildly displaced fracture. Mild osseous contusion of the medial patellar facet with a small nondisplaced fracture.  IMPRESSION: 1. Findings most consistent with transient lateral patellar dislocation. Severe osseous contusion of the anterior peripheral lateral femoral condyle with a mildly displaced fracture fragment. Mild osseous contusion of the medial patellar facet with a small nondisplaced fracture. 2. Large joint effusion.     Electronically Signed   By: Elige Ko   On: 06/01/2015 14:09

## 2015-06-11 ENCOUNTER — Encounter (HOSPITAL_COMMUNITY): Payer: Self-pay | Admitting: *Deleted

## 2015-06-11 ENCOUNTER — Ambulatory Visit (HOSPITAL_COMMUNITY): Payer: BLUE CROSS/BLUE SHIELD | Admitting: Anesthesiology

## 2015-06-11 ENCOUNTER — Encounter (HOSPITAL_COMMUNITY)
Admission: RE | Disposition: A | Discharge status: Home / Self Care | Payer: Self-pay | Source: Ambulatory Visit | Attending: Orthopedic Surgery

## 2015-06-11 ENCOUNTER — Ambulatory Visit (HOSPITAL_COMMUNITY)
Admission: RE | Admit: 2015-06-11 | Discharge: 2015-06-11 | Disposition: A | Discharge status: Home / Self Care | Payer: BLUE CROSS/BLUE SHIELD | Source: Ambulatory Visit | Attending: Orthopedic Surgery | Admitting: Orthopedic Surgery

## 2015-06-11 DIAGNOSIS — S83005D Unspecified dislocation of left patella, subsequent encounter: Secondary | ICD-10-CM | POA: Diagnosis not present

## 2015-06-11 DIAGNOSIS — S83002A Unspecified subluxation of left patella, initial encounter: Secondary | ICD-10-CM | POA: Insufficient documentation

## 2015-06-11 DIAGNOSIS — F17210 Nicotine dependence, cigarettes, uncomplicated: Secondary | ICD-10-CM | POA: Diagnosis not present

## 2015-06-11 DIAGNOSIS — S82092A Other fracture of left patella, initial encounter for closed fracture: Secondary | ICD-10-CM | POA: Insufficient documentation

## 2015-06-11 DIAGNOSIS — M2342 Loose body in knee, left knee: Secondary | ICD-10-CM | POA: Diagnosis present

## 2015-06-11 DIAGNOSIS — G43909 Migraine, unspecified, not intractable, without status migrainosus: Secondary | ICD-10-CM | POA: Insufficient documentation

## 2015-06-11 DIAGNOSIS — W010XXA Fall on same level from slipping, tripping and stumbling without subsequent striking against object, initial encounter: Secondary | ICD-10-CM | POA: Insufficient documentation

## 2015-06-11 DIAGNOSIS — S83006A Unspecified dislocation of unspecified patella, initial encounter: Secondary | ICD-10-CM | POA: Insufficient documentation

## 2015-06-11 DIAGNOSIS — Y92009 Unspecified place in unspecified non-institutional (private) residence as the place of occurrence of the external cause: Secondary | ICD-10-CM | POA: Insufficient documentation

## 2015-06-11 HISTORY — PX: KNEE ARTHROSCOPY: SHX127

## 2015-06-11 SURGERY — ARTHROSCOPY, KNEE
Anesthesia: General | Site: Knee | Laterality: Left

## 2015-06-11 MED ORDER — BUPIVACAINE-EPINEPHRINE (PF) 0.5% -1:200000 IJ SOLN
INTRAMUSCULAR | Status: AC
  Filled 2015-06-11: qty 60

## 2015-06-11 MED ORDER — EPINEPHRINE HCL 1 MG/ML IJ SOLN
INTRAMUSCULAR | Status: AC
  Filled 2015-06-11: qty 4

## 2015-06-11 MED ORDER — HYDROCODONE-ACETAMINOPHEN 5-325 MG PO TABS
ORAL_TABLET | ORAL | Status: AC
  Filled 2015-06-11: qty 1

## 2015-06-11 MED ORDER — ONDANSETRON HCL 4 MG/2ML IJ SOLN
4.0000 mg | Freq: Once | INTRAMUSCULAR | Status: AC
  Administered 2015-06-11: 4 mg via INTRAVENOUS

## 2015-06-11 MED ORDER — SODIUM CHLORIDE 0.9 % IJ SOLN
INTRAMUSCULAR | Status: AC
  Filled 2015-06-11: qty 10

## 2015-06-11 MED ORDER — ARTIFICIAL TEARS OP OINT
TOPICAL_OINTMENT | OPHTHALMIC | Status: DC | PRN
Start: 2015-06-11 — End: 2015-06-11
  Administered 2015-06-11: 1 via OPHTHALMIC

## 2015-06-11 MED ORDER — HYDROCODONE-ACETAMINOPHEN 5-325 MG PO TABS
1.0000 | ORAL_TABLET | Freq: Once | ORAL | Status: AC
  Administered 2015-06-11: 1 via ORAL

## 2015-06-11 MED ORDER — LIDOCAINE HCL (CARDIAC) 20 MG/ML IV SOLN
INTRAVENOUS | Status: DC | PRN
  Administered 2015-06-11: 50 mg via INTRAVENOUS

## 2015-06-11 MED ORDER — ONDANSETRON HCL 4 MG/2ML IJ SOLN
INTRAMUSCULAR | Status: AC
  Filled 2015-06-11: qty 2

## 2015-06-11 MED ORDER — PROPOFOL 10 MG/ML IV BOLUS
INTRAVENOUS | Status: AC
  Filled 2015-06-11: qty 20

## 2015-06-11 MED ORDER — IBUPROFEN 800 MG PO TABS: 800.0000 mg | ORAL_TABLET | Freq: Three times a day (TID) | ORAL | Status: DC | PRN

## 2015-06-11 MED ORDER — ONDANSETRON HCL 4 MG/2ML IJ SOLN: 4.0000 mg | Freq: Once | INTRAMUSCULAR | Status: DC | PRN

## 2015-06-11 MED ORDER — CEFAZOLIN SODIUM-DEXTROSE 2-3 GM-% IV SOLR
INTRAVENOUS | Status: DC | PRN
  Administered 2015-06-11: 2 g via INTRAVENOUS

## 2015-06-11 MED ORDER — PROMETHAZINE HCL 12.5 MG PO TABS: 12.5000 mg | ORAL_TABLET | Freq: Four times a day (QID) | ORAL | Status: DC | PRN

## 2015-06-11 MED ORDER — KETOROLAC TROMETHAMINE 30 MG/ML IJ SOLN
30.0000 mg | Freq: Once | INTRAMUSCULAR | Status: AC
  Administered 2015-06-11: 30 mg via INTRAVENOUS

## 2015-06-11 MED ORDER — MIDAZOLAM HCL 2 MG/2ML IJ SOLN
INTRAMUSCULAR | Status: AC
  Filled 2015-06-11: qty 2

## 2015-06-11 MED ORDER — KETOROLAC TROMETHAMINE 30 MG/ML IJ SOLN
INTRAMUSCULAR | Status: AC
  Filled 2015-06-11: qty 1

## 2015-06-11 MED ORDER — FENTANYL CITRATE (PF) 250 MCG/5ML IJ SOLN
INTRAMUSCULAR | Status: AC
  Filled 2015-06-11: qty 5

## 2015-06-11 MED ORDER — EPINEPHRINE HCL 1 MG/ML IJ SOLN
INTRAMUSCULAR | Status: DC | PRN
  Administered 2015-06-11 (×3): 3000 mL

## 2015-06-11 MED ORDER — FENTANYL CITRATE (PF) 100 MCG/2ML IJ SOLN
25.0000 ug | INTRAMUSCULAR | Status: AC
  Administered 2015-06-11 (×2): 25 ug via INTRAVENOUS

## 2015-06-11 MED ORDER — HYDROCODONE-ACETAMINOPHEN 10-325 MG PO TABS: 1.0000 | ORAL_TABLET | Freq: Four times a day (QID) | ORAL | Status: DC | PRN

## 2015-06-11 MED ORDER — FENTANYL CITRATE (PF) 100 MCG/2ML IJ SOLN
INTRAMUSCULAR | Status: AC
  Filled 2015-06-11: qty 2

## 2015-06-11 MED ORDER — LACTATED RINGERS IV SOLN
INTRAVENOUS | Status: DC
  Administered 2015-06-11 (×2): via INTRAVENOUS

## 2015-06-11 MED ORDER — CEFAZOLIN SODIUM-DEXTROSE 2-3 GM-% IV SOLR
2.0000 g | INTRAVENOUS | Status: AC
  Administered 2015-06-11: 2 g via INTRAVENOUS
  Filled 2015-06-11: qty 50

## 2015-06-11 MED ORDER — ROCURONIUM BROMIDE 50 MG/5ML IV SOLN
INTRAVENOUS | Status: AC
  Filled 2015-06-11: qty 1

## 2015-06-11 MED ORDER — BUPIVACAINE-EPINEPHRINE (PF) 0.5% -1:200000 IJ SOLN
INTRAMUSCULAR | Status: DC | PRN
  Administered 2015-06-11: 60 mL

## 2015-06-11 MED ORDER — PROPOFOL 10 MG/ML IV BOLUS
INTRAVENOUS | Status: DC | PRN
  Administered 2015-06-11: 160 mg via INTRAVENOUS

## 2015-06-11 MED ORDER — MIDAZOLAM HCL 2 MG/2ML IJ SOLN
1.0000 mg | INTRAMUSCULAR | Status: DC | PRN
  Administered 2015-06-11: 2 mg via INTRAVENOUS

## 2015-06-11 MED ORDER — FENTANYL CITRATE (PF) 100 MCG/2ML IJ SOLN
INTRAMUSCULAR | Status: DC | PRN
  Administered 2015-06-11: 50 ug via INTRAVENOUS
  Administered 2015-06-11: 25 ug via INTRAVENOUS
  Administered 2015-06-11: 50 ug via INTRAVENOUS
  Administered 2015-06-11 (×5): 25 ug via INTRAVENOUS

## 2015-06-11 MED ORDER — FENTANYL CITRATE (PF) 100 MCG/2ML IJ SOLN
25.0000 ug | INTRAMUSCULAR | Status: DC | PRN
  Administered 2015-06-11: 50 ug via INTRAVENOUS

## 2015-06-11 MED ORDER — LIDOCAINE HCL (PF) 1 % IJ SOLN
INTRAMUSCULAR | Status: AC
  Filled 2015-06-11: qty 5

## 2015-06-11 MED ORDER — EPHEDRINE SULFATE 50 MG/ML IJ SOLN
INTRAMUSCULAR | Status: AC
  Filled 2015-06-11: qty 1

## 2015-06-11 SURGICAL SUPPLY — 53 items
ARTHROWAND PARAGON T2 (SURGICAL WAND)
BAG HAMPER (MISCELLANEOUS) ×2 IMPLANT
BANDAGE ELASTIC 6 VELCRO NS (GAUZE/BANDAGES/DRESSINGS) ×2 IMPLANT
BANDAGE ELASTIC 6 VELCRO ST LF (GAUZE/BANDAGES/DRESSINGS) ×1 IMPLANT
BLADE AGGRESSIVE PLUS 4.0 (BLADE) ×2 IMPLANT
BLADE SURG SZ11 CARB STEEL (BLADE) ×2 IMPLANT
CHLORAPREP W/TINT 26ML (MISCELLANEOUS) ×2 IMPLANT
CLOTH BEACON ORANGE TIMEOUT ST (SAFETY) ×2 IMPLANT
COOLER CRYO IC GRAV AND TUBE (ORTHOPEDIC SUPPLIES) ×1 IMPLANT
COVER PROBE W GEL 5X96 (DRAPES) ×2 IMPLANT
CUFF CRYO KNEE18X23 MED (MISCELLANEOUS) ×1 IMPLANT
CUFF TOURNIQUET SINGLE 34IN LL (TOURNIQUET CUFF) ×1 IMPLANT
CUTTER ANGLED DBL BITE 4.5 (BURR) IMPLANT
DECANTER SPIKE VIAL GLASS SM (MISCELLANEOUS) ×4 IMPLANT
DRSG PAD ABDOMINAL 8X10 ST (GAUZE/BANDAGES/DRESSINGS) ×1 IMPLANT
GAUZE SPONGE 4X4 12PLY STRL (GAUZE/BANDAGES/DRESSINGS) ×1 IMPLANT
GAUZE SPONGE 4X4 16PLY XRAY LF (GAUZE/BANDAGES/DRESSINGS) ×2 IMPLANT
GAUZE XEROFORM 5X9 LF (GAUZE/BANDAGES/DRESSINGS) ×2 IMPLANT
GLOVE SKINSENSE NS SZ8.0 LF (GLOVE) ×1
GLOVE SKINSENSE STRL SZ8.0 LF (GLOVE) ×1 IMPLANT
GLOVE SS N UNI LF 8.5 STRL (GLOVE) ×2 IMPLANT
GOWN STRL REUS W/TWL LRG LVL3 (GOWN DISPOSABLE) ×2 IMPLANT
GOWN STRL REUS W/TWL XL LVL3 (GOWN DISPOSABLE) ×2 IMPLANT
HLDR LEG FOAM (MISCELLANEOUS) ×1 IMPLANT
IV NS IRRIG 3000ML ARTHROMATIC (IV SOLUTION) ×5 IMPLANT
KIT BLADEGUARD II DBL (SET/KITS/TRAYS/PACK) ×2 IMPLANT
KIT ROOM TURNOVER AP CYSTO (KITS) ×2 IMPLANT
LEG HOLDER FOAM (MISCELLANEOUS) ×1
MANIFOLD NEPTUNE II (INSTRUMENTS) ×2 IMPLANT
MARKER SKIN DUAL TIP RULER LAB (MISCELLANEOUS) ×2 IMPLANT
NDL HYPO 18GX1.5 BLUNT FILL (NEEDLE) ×1 IMPLANT
NDL HYPO 21X1.5 SAFETY (NEEDLE) ×1 IMPLANT
NDL SPNL 18GX3.5 QUINCKE PK (NEEDLE) ×1 IMPLANT
NEEDLE HYPO 18GX1.5 BLUNT FILL (NEEDLE) ×2 IMPLANT
NEEDLE HYPO 21X1.5 SAFETY (NEEDLE) ×2 IMPLANT
NEEDLE SPNL 18GX3.5 QUINCKE PK (NEEDLE) ×2 IMPLANT
NS IRRIG 1000ML POUR BTL (IV SOLUTION) ×2 IMPLANT
PACK ARTHRO LIMB DRAPE STRL (MISCELLANEOUS) ×2 IMPLANT
PAD ABD 5X9 TENDERSORB (GAUZE/BANDAGES/DRESSINGS) ×2 IMPLANT
PAD ARMBOARD 7.5X6 YLW CONV (MISCELLANEOUS) ×2 IMPLANT
PADDING CAST COTTON 6X4 STRL (CAST SUPPLIES) ×2 IMPLANT
PADDING WEBRIL 6 STERILE (GAUZE/BANDAGES/DRESSINGS) ×1 IMPLANT
SET ARTHROSCOPY INST (INSTRUMENTS) ×2 IMPLANT
SET ARTHROSCOPY PUMP TUBE (IRRIGATION / IRRIGATOR) ×2 IMPLANT
SET BASIN LINEN APH (SET/KITS/TRAYS/PACK) ×2 IMPLANT
SPONGE GAUZE 4X4 12PLY (GAUZE/BANDAGES/DRESSINGS) ×1 IMPLANT
SUT ETHILON 3 0 FSL (SUTURE) ×1 IMPLANT
SYR 30ML LL (SYRINGE) ×2 IMPLANT
SYRINGE 10CC LL (SYRINGE) ×2 IMPLANT
WAND 50 DEG COVAC W/CORD (SURGICAL WAND) IMPLANT
WAND 90 DEG TURBOVAC W/CORD (SURGICAL WAND) ×1 IMPLANT
WAND ARTHRO PARAGON T2 (SURGICAL WAND) IMPLANT
YANKAUER SUCT BULB TIP 10FT TU (MISCELLANEOUS) ×6 IMPLANT

## 2015-06-11 NOTE — Op Note (Signed)
06/11/2015  11:14 AM  PATIENT:  Philip Hughes  29 y.o. male  PRE-OPERATIVE DIAGNOSIS:  left knee loose body  POST-OPERATIVE DIAGNOSIS:  left knee loose body, left knee patella dislocation, chondral fracture medial patella and subchondral fracture lateral femoral condyle, sprain of the medial patellofemoral ligament  Operative findings heavy debris in the knee. Loose chondral fragment in the synovium of the lateral compartment chondral fracture from the medial edge of the patella from the medial facet  Intact anterior cruciate ligament, PCL, medial and lateral meniscus.  The medial retinaculum was intact although stretched. The patient had mild subluxation of the patella at 45 flexion.  Under anesthesia in extension the patella could not be dislocated and at 30 of flexion there was no subluxation dislocation.  Details of procedure  Philip Hughes was identified in the preop area. The surgical site was confirmed as left knee. The chart review was completed. The patient was taken to the operating room for general anesthesia with an LMA. He had 2 g of Ancef prior to surgery.  He was in the supine position. His left leg was prepped and draped sterilely.  A timeout was taken to confirm the patient's identification, procedure, equipment. All agreed on the procedure and we proceeded.  Standard lateral portal was established. Scope was placed in the joint. Significant amount of hemarthrosis was noted and cleared with suction and irrigation.  A diagnostic arthroscopy was performed. The findings are listed above. We turned our attention to the loose body and removed from the lateral compartment it had attached to the synovium. The patella was evaluated in extension and progressive degrees of flexion. We did find some subluxation but no dislocation and it was not dislocatable manually under anesthesia.  We removed the loose body. We coagulated bleeding soft tissue. We examined the medial  retinaculum and various angles and found to be stretched but not torn and there was no evidence of detachment  The knee was irrigated and closed with 3-0 nylon sutures one in each portal and we injected 60 mL of Marcaine with epinephrine  A sterile dressing was applied along with a Cryo/Cuff which was activated. The LMA was removed and the patient was taken to recovery room in stable condition  PROCEDURE:  Procedure(s) with comments: LEFT KNEE ARTHROSCOPIC LOOSE BODY REMOVAL   SURGEON:  Surgeon(s) and Role:    * Vickki Hearing, MD - Primary  PHYSICIAN ASSISTANT:   ASSISTANTS: none   ANESTHESIA:   general  EBL:  Total I/O In: 1000 [I.V.:1000] Out: 10 [Blood:10]  BLOOD ADMINISTERED:none  DRAINS: none   LOCAL MEDICATIONS USED:  MARCAINE     SPECIMEN:  No Specimen  DISPOSITION OF SPECIMEN:  N/A  COUNTS:  YES  TOURNIQUET:    DICTATION: .Dragon Dictation  PLAN OF CARE: Discharge to home after PACU  PATIENT DISPOSITION:  PACU - hemodynamically stable.   Delay start of Pharmacological VTE agent (>24hrs) due to surgical blood loss or risk of bleeding: not applicable  Postoperative plan a straight leg knee immobilizer for 3 weeks straight leg raises in all planes can be done in the immobilizer. Active range of motion twice daily 25 reps as tolerated.  Patellofemoral stabilizer for 6 weeks after immobilizers removed and continue quadriceps strengthening program

## 2015-06-11 NOTE — Transfer of Care (Signed)
Immediate Anesthesia Transfer of Care Note  Patient: Philip Hughes  Procedure(s) Performed: Procedure(s) with comments: LEFT KNEE ARTHROSCOPIC LOOSE BODY REMOVAL WITH POSSIBLE MEDIAL RETINACULAR REPAIR (Left) - loose body removal and possible medial retinacular repair  Patient Location: PACU  Anesthesia Type:General  Level of Consciousness: awake, alert , oriented and patient cooperative  Airway & Oxygen Therapy: Patient Spontanous Breathing  Post-op Assessment: Report given to RN and Post -op Vital signs reviewed and stable  Post vital signs: Reviewed and stable  Last Vitals:  Filed Vitals:   06/11/15 1015  BP: 113/70  Pulse:   Temp:   Resp: 22    Complications: No apparent anesthesia complications

## 2015-06-11 NOTE — H&P (View-Only) (Signed)
Incentive Spirometer teaching done. Patient demonstrated understanding 

## 2015-06-11 NOTE — Anesthesia Procedure Notes (Signed)
Procedure Name: LMA Insertion Date/Time: 06/11/2015 10:27 AM Performed by: Pernell Dupre, Antoninette Lerner A Pre-anesthesia Checklist: Patient identified, Timeout performed, Emergency Drugs available, Patient being monitored and Suction available Patient Re-evaluated:Patient Re-evaluated prior to inductionOxygen Delivery Method: Circle system utilized Preoxygenation: Pre-oxygenation with 100% oxygen Intubation Type: IV induction Ventilation: Mask ventilation without difficulty LMA: LMA inserted LMA Size: 5.0 Number of attempts: 1 Placement Confirmation: positive ETCO2 Tube secured with: Tape Dental Injury: Teeth and Oropharynx as per pre-operative assessment

## 2015-06-11 NOTE — Anesthesia Postprocedure Evaluation (Signed)
  Anesthesia Post-op Note Late Entry Patient: Philip Hughes  Procedure(s) Performed: Procedure(s): LEFT KNEE ARTHROSCOPY WITH LOOSE BODY REMOVAL (Left)  Patient Location: PACU  Anesthesia Type:General  Level of Consciousness: awake, alert , oriented and patient cooperative  Airway and Oxygen Therapy: Patient Spontanous Breathing  Post-op Pain: mild  Post-op Assessment: Post-op Vital signs reviewed, Patient's Cardiovascular Status Stable, Respiratory Function Stable, Patent Airway, No signs of Nausea or vomiting, Pain level controlled and No headache              Post-op Vital Signs: Reviewed and stable  Last Vitals:  Filed Vitals:   06/11/15 1207  BP: 124/81  Pulse: 84  Temp: 36.6 C  Resp: 12    Complications: No apparent anesthesia complications

## 2015-06-11 NOTE — Progress Notes (Signed)
Pharmacist, hospital done. Patient demonstrated understanding

## 2015-06-11 NOTE — Brief Op Note (Signed)
06/11/2015  11:14 AM  PATIENT:  Philip Hughes  29 y.o. male  PRE-OPERATIVE DIAGNOSIS:  left knee loose body  POST-OPERATIVE DIAGNOSIS:  left knee loose body, left knee patella dislocation, chondral fracture medial patella and subchondral fracture lateral femoral condyle, sprain of the medial patellofemoral ligament  Operative findings heavy debris in the knee. Loose chondral fragment in the synovium of the lateral compartment chondral fracture from the medial edge of the patella from the medial facet  Intact anterior cruciate ligament, PCL, medial and lateral meniscus.  The medial retinaculum was intact although stretched. The patient had mild subluxation of the patella at 45 flexion.  Under anesthesia in extension the patella could not be dislocated and at 30 of flexion there was no subluxation dislocation.  Details of procedure  Keath Gunnoe was identified in the preop area. The surgical site was confirmed as left knee. The chart review was completed. The patient was taken to the operating room for general anesthesia with an LMA. He had 2 g of Ancef prior to surgery.  He was in the supine position. His left leg was prepped and draped sterilely.  A timeout was taken to confirm the patient's identification, procedure, equipment. All agreed on the procedure and we proceeded.  Standard lateral portal was established. Scope was placed in the joint. Significant amount of hemarthrosis was noted and cleared with suction and irrigation.  A diagnostic arthroscopy was performed. The findings are listed above. We turned our attention to the loose body and removed from the lateral compartment it had attached to the synovium. The patella was evaluated in extension and progressive degrees of flexion. We did find some subluxation but no dislocation and it was not dislocatable manually under anesthesia.  We removed the loose body. We coagulated bleeding soft tissue. We examined the medial  retinaculum and various angles and found to be stretched but not torn and there was no evidence of detachment  The knee was irrigated and closed with 3-0 nylon sutures one in each portal and we injected 60 mL of Marcaine with epinephrine  A sterile dressing was applied along with a Cryo/Cuff which was activated. The LMA was removed and the patient was taken to recovery room in stable condition  PROCEDURE:  Procedure(s) with comments: LEFT KNEE ARTHROSCOPIC LOOSE BODY REMOVAL   SURGEON:  Surgeon(s) and Role:    * Clairessa Boulet E Eddis Pingleton, MD - Primary  PHYSICIAN ASSISTANT:   ASSISTANTS: none   ANESTHESIA:   general  EBL:  Total I/O In: 1000 [I.V.:1000] Out: 10 [Blood:10]  BLOOD ADMINISTERED:none  DRAINS: none   LOCAL MEDICATIONS USED:  MARCAINE     SPECIMEN:  No Specimen  DISPOSITION OF SPECIMEN:  N/A  COUNTS:  YES  TOURNIQUET:    DICTATION: .Dragon Dictation  PLAN OF CARE: Discharge to home after PACU  PATIENT DISPOSITION:  PACU - hemodynamically stable.   Delay start of Pharmacological VTE agent (>24hrs) due to surgical blood loss or risk of bleeding: not applicable  Postoperative plan a straight leg knee immobilizer for 3 weeks straight leg raises in all planes can be done in the immobilizer. Active range of motion twice daily 25 reps as tolerated.  Patellofemoral stabilizer for 6 weeks after immobilizers removed and continue quadriceps strengthening program  

## 2015-06-11 NOTE — Interval H&P Note (Signed)
History and Physical Interval Note:  06/11/2015 10:10 AM  Philip Hughes  has presented today for surgery, with the diagnosis of left knee loose body  The various methods of treatment have been discussed with the patient and family. After consideration of risks, benefits and other options for treatment, the patient has consented to  Procedure(s) with comments: LEFT KNEE ARTHROSCOPIC LOOSE BODY REMOVAL WITH POSSIBLE MEDIAL RETINACULAR REPAIR (Left) - loose body removal and possible medial retinacular repair as a surgical intervention .  The patient's history has been reviewed, patient examined, no change in status, stable for surgery.  I have reviewed the patient's chart and labs.  Questions were answered to the patient's satisfaction.     Fuller Canada

## 2015-06-11 NOTE — Anesthesia Preprocedure Evaluation (Addendum)
Anesthesia Evaluation  Patient identified by MRN, date of birth, ID band Patient awake    Reviewed: Allergy & Precautions, NPO status , Patient's Chart, lab work & pertinent test results  Airway Mallampati: I  TM Distance: >3 FB     Dental  (+) Poor Dentition, Missing, Chipped, Dental Advisory Given,    Pulmonary Current Smoker,  breath sounds clear to auscultation        Cardiovascular negative cardio ROS  Rhythm:Regular Rate:Normal     Neuro/Psych  Headaches,    GI/Hepatic negative GI ROS,   Endo/Other    Renal/GU      Musculoskeletal   Abdominal   Peds  Hematology   Anesthesia Other Findings   Reproductive/Obstetrics                           Anesthesia Physical Anesthesia Plan  ASA: II  Anesthesia Plan: General LMA   Post-op Pain Management:    Induction: Intravenous  Airway Management Planned: LMA  Additional Equipment:   Intra-op Plan:   Post-operative Plan: Extubation in OR  Informed Consent: I have reviewed the patients History and Physical, chart, labs and discussed the procedure including the risks, benefits and alternatives for the proposed anesthesia with the patient or authorized representative who has indicated his/her understanding and acceptance.     Plan Discussed with:   Anesthesia Plan Comments:       Anesthesia Quick Evaluation

## 2015-06-14 ENCOUNTER — Ambulatory Visit (INDEPENDENT_AMBULATORY_CARE_PROVIDER_SITE_OTHER): Payer: BLUE CROSS/BLUE SHIELD | Admitting: Orthopedic Surgery

## 2015-06-14 ENCOUNTER — Encounter (HOSPITAL_COMMUNITY): Payer: Self-pay | Admitting: Orthopedic Surgery

## 2015-06-14 ENCOUNTER — Encounter: Payer: Self-pay | Admitting: Orthopedic Surgery

## 2015-06-14 VITALS — BP 123/72 | Ht 71.0 in | Wt 200.0 lb

## 2015-06-14 DIAGNOSIS — S83005D Unspecified dislocation of left patella, subsequent encounter: Secondary | ICD-10-CM

## 2015-06-14 MED ORDER — HYDROCODONE-ACETAMINOPHEN 10-325 MG PO TABS: 1.0000 | ORAL_TABLET | Freq: Four times a day (QID) | ORAL | Status: DC | PRN

## 2015-06-14 NOTE — Progress Notes (Signed)
Patient ID: Philip Hughes, male   DOB: 11-02-1986, 29 y.o.   MRN: [id number]  Chief Complaint  Patient presents with  . Follow-up    post op 1, LEFT KNEE SCOPE, LOOSE BODY REMOVAL, DOS 06/11/15    BP 123/72 mmHg  Ht 5\' 11"  (1.803 m)  Wt 200 lb (90.719 kg)  BMI 27.91 kg/m2  Encounter Diagnosis  Name Primary?  . Dislocated patella, left, subsequent encounter Yes    The patient had a patellar subluxation dislocation relocation episode developed a loose body which I removed arthroscopically also had a retinacular sprain which was healing and not thought to be loose enough for repair he also had a chondral fracture lateral femoral condyle  His wounds are healed nicely has small effusion is neurovascularly intact  Sutures are removed  Continue weightbearing as tolerated and straight leg brace  Start straight leg raises 30 per day weight-bear as tolerated  Comback 3 weeks to place lateral stabilizer brace    we expect return to work date August 1

## 2015-07-05 ENCOUNTER — Ambulatory Visit (INDEPENDENT_AMBULATORY_CARE_PROVIDER_SITE_OTHER): Payer: Self-pay | Admitting: Orthopedic Surgery

## 2015-07-05 ENCOUNTER — Encounter: Payer: Self-pay | Admitting: Orthopedic Surgery

## 2015-07-05 DIAGNOSIS — S83005D Unspecified dislocation of left patella, subsequent encounter: Secondary | ICD-10-CM

## 2015-07-05 DIAGNOSIS — Z4789 Encounter for other orthopedic aftercare: Secondary | ICD-10-CM

## 2015-07-05 MED ORDER — HYDROCODONE-ACETAMINOPHEN 5-325 MG PO TABS: 1.0000 | ORAL_TABLET | ORAL | Status: DC | PRN

## 2015-07-05 NOTE — Patient Instructions (Signed)
RTW AUG 1 WITH BRACE ON   CONTINUE EXERCISES AS GIVEN

## 2015-07-05 NOTE — Progress Notes (Signed)
Patient ID: Philip Hughes, male   DOB: Sep 22, 1986, 29 y.o.   MRN: [id number]  Follow up visit  Chief Complaint  Patient presents with  . Knee Problem    Postop visit status post knee arthroscopy for patellar dislocation    There were no vitals taken for this visit.  Encounter Diagnoses  Name Primary?  . Dislocated patella, left, subsequent encounter   . Surgical aftercare, musculoskeletal system Yes    Postop week #3 status post arthroscopy left knee he had a patellar dislocation we did not have to do a repair  He's been in a straight leg brace for 3 weeks  He is doing well he's walking well he has mild knee effusion he has knee flexion 90  He is stable patella  He will go into a lateral J stabilizer start home knee exercises with a PEP pad  Continue Norco 5 mg every 4 #84 follow-up August 8. He may return to work in his brace August 1

## 2015-08-02 ENCOUNTER — Encounter: Payer: Self-pay | Admitting: Orthopedic Surgery

## 2015-08-02 ENCOUNTER — Ambulatory Visit: Payer: BLUE CROSS/BLUE SHIELD | Admitting: Orthopedic Surgery

## 2015-09-06 ENCOUNTER — Ambulatory Visit (INDEPENDENT_AMBULATORY_CARE_PROVIDER_SITE_OTHER): Payer: BLUE CROSS/BLUE SHIELD | Admitting: Family Medicine

## 2015-09-06 ENCOUNTER — Encounter: Payer: Self-pay | Admitting: Family Medicine

## 2015-09-06 VITALS — BP 120/74 | Temp 98.3°F | Ht 71.0 in | Wt 199.4 lb

## 2015-09-06 DIAGNOSIS — J329 Chronic sinusitis, unspecified: Secondary | ICD-10-CM

## 2015-09-06 DIAGNOSIS — J310 Chronic rhinitis: Secondary | ICD-10-CM

## 2015-09-06 DIAGNOSIS — J683 Other acute and subacute respiratory conditions due to chemicals, gases, fumes and vapors: Secondary | ICD-10-CM

## 2015-09-06 DIAGNOSIS — J4520 Mild intermittent asthma, uncomplicated: Secondary | ICD-10-CM | POA: Diagnosis not present

## 2015-09-06 MED ORDER — ALBUTEROL SULFATE HFA 108 (90 BASE) MCG/ACT IN AERS: 2.0000 | INHALATION_SPRAY | Freq: Four times a day (QID) | RESPIRATORY_TRACT | Status: DC | PRN

## 2015-09-06 MED ORDER — AMOXICILLIN-POT CLAVULANATE 875-125 MG PO TABS: 1.0000 | ORAL_TABLET | Freq: Two times a day (BID) | ORAL | Status: AC

## 2015-09-06 NOTE — Progress Notes (Signed)
   Subjective:    Patient ID: Philip Hughes, male    DOB: 23-Aug-1986, 29 y.o.   MRN: [id number]  Cough This is a new problem. The current episode started in the past 7 days. The problem has been gradually worsening. The problem occurs every few minutes. The cough is productive of sputum. Associated symptoms include rhinorrhea and wheezing. The symptoms are aggravated by animals (Cat). He has tried OTC cough suppressant (Dayquil) for the symptoms. The treatment provided no relief.   Patient states no other concerns this visit.  Stuffy nose and aching  And now frontal headache  Cough productive in mature  No sig fever  Still smoking tho not as bad    Review of Systems  HENT: Positive for rhinorrhea.   Respiratory: Positive for cough and wheezing.    No nausea no vomiting    Objective:   Physical Exam Alert vitals stable mild malaise HET moderate his congestion frontal tenderness lungs expiratory wheezes heart rare rhythm       Assessment & Plan:  Impression rhinosinusitis/bronchitis with reactive airways plan albuterol prescribed. He will use discussed. Smoking cessation discussed. Chantix encourage patient to consider. Antibiotics prescribed. WSL

## 2016-02-24 ENCOUNTER — Ambulatory Visit: Payer: BLUE CROSS/BLUE SHIELD | Admitting: Family Medicine

## 2016-02-29 ENCOUNTER — Ambulatory Visit (INDEPENDENT_AMBULATORY_CARE_PROVIDER_SITE_OTHER): Payer: BLUE CROSS/BLUE SHIELD | Admitting: Family Medicine

## 2016-02-29 ENCOUNTER — Encounter: Payer: Self-pay | Admitting: Family Medicine

## 2016-02-29 VITALS — BP 128/80 | Temp 97.9°F | Ht 71.0 in | Wt 207.2 lb

## 2016-02-29 DIAGNOSIS — Z113 Encounter for screening for infections with a predominantly sexual mode of transmission: Secondary | ICD-10-CM

## 2016-02-29 NOTE — Progress Notes (Signed)
   Subjective:    Patient ID: Philip Hughes, male    DOB: May 06, 1986, 30 y.o.   MRN: [id number]  HPI Patient is here today because he wants to have a STD screen completed. Patient is concerned because he has had sexual relations with his new girlfriend. She in turn has been told by her old boyfriend that he has hepatitis C.  No other symptoms. No abdominal pain no nausea no dysuria.  No other symptoms of other STDs. Next  Since he has participated in unprotected sex he would like to be checked for other blood borne illnesses Patient has no other concerns at this time.    Review of Systems No headache no chest pain no back pain no abdominal pain no change in bowel habits    Objective:   Physical Exam  Alert vital stable lungs clear heart rhythm H&T normal      Assessment & Plan:  Impression #1 positive hepatitis C exposure along with patient anxieties regarding other blood borne STDs with history of high-risk behavior plan appropriate blood work. Patient declines urethral swab WSL 15 minutes spent most in discussion

## 2016-03-06 LAB — HEPATITIS C ANTIBODY

## 2016-03-06 LAB — HIV ANTIBODY (ROUTINE TESTING W REFLEX): HIV Screen 4th Generation wRfx: NONREACTIVE

## 2016-03-06 LAB — VDRL: Non-Treponemal Screening VDRL: NORMAL

## 2016-03-08 ENCOUNTER — Encounter: Payer: Self-pay | Admitting: Family Medicine

## 2016-10-10 ENCOUNTER — Ambulatory Visit (INDEPENDENT_AMBULATORY_CARE_PROVIDER_SITE_OTHER): Payer: BLUE CROSS/BLUE SHIELD | Admitting: Family Medicine

## 2016-10-10 ENCOUNTER — Encounter: Payer: Self-pay | Admitting: Family Medicine

## 2016-10-10 VITALS — BP 132/80 | Ht 71.0 in | Wt 218.0 lb

## 2016-10-10 DIAGNOSIS — K2100 Gastro-esophageal reflux disease with esophagitis, without bleeding: Secondary | ICD-10-CM

## 2016-10-10 MED ORDER — PANTOPRAZOLE SODIUM 40 MG PO TBEC: 40.0000 mg | DELAYED_RELEASE_TABLET | Freq: Every day | ORAL | 11 refills | Status: DC

## 2016-10-10 NOTE — Progress Notes (Signed)
   Subjective:    Patient ID: Philip Hughes, male    DOB: 1986-08-02, 30 y.o.   MRN: [id number]  Gastroesophageal Reflux  He complains of abdominal pain, belching, heartburn and nausea. vomiting. Episode onset: 3 -4 months ago off and on. Treatments tried: tums. The treatment provided significant relief.   This yr has had rough time  Notes certain foods giving trouble ate a lot of junk foods plus some beer dringking on the weekend  Often takes aspirin full strength  Mainly gaking for aches and pains     Review of Systems  Gastrointestinal: Positive for abdominal pain, heartburn and nausea.       Objective:   Physical Exam Alert vitals stable, NAD. Blood pressure good on repeat. HEENT normal. Lungs clear. Heart regular rate and rhythm.    Mild to moderate epigastric tenderness    Assessment & Plan:  Impression reflux esophagitis with element of gastritis plan cut down smoking. Cut down nicotine. Avoid aspirin use. Initiate protonic 40 one every morning with refills try to back off after stabilized discussed

## 2016-10-10 NOTE — Patient Instructions (Signed)
r Gastroesophageal Reflux Disease, Adult Normally, food travels down the esophagus and stays in the stomach to be digested. However, when a person has gastroesophageal reflux disease (GERD), food and stomach acid move back up into the esophagus. When this happens, the esophagus becomes sore and inflamed. Over time, GERD can create small holes (ulcers) in the lining of the esophagus.  CAUSES This condition is caused by a problem with the muscle between the esophagus and the stomach (lower esophageal sphincter, or LES). Normally, the LES muscle closes after food passes through the esophagus to the stomach. When the LES is weakened or abnormal, it does not close properly, and that allows food and stomach acid to go back up into the esophagus. The LES can be weakened by certain dietary substances, medicines, and medical conditions, including:  Tobacco use.  Pregnancy.  Having a hiatal hernia.  Heavy alcohol use.  Certain foods and beverages, such as coffee, chocolate, onions, and peppermint. RISK FACTORS This condition is more likely to develop in:  People who have an increased body weight.  People who have connective tissue disorders.  People who use NSAID medicines. SYMPTOMS Symptoms of this condition include:  Heartburn.  Difficult or painful swallowing.  The feeling of having a lump in the throat.  Abitter taste in the mouth.  Bad breath.  Having a large amount of saliva.  Having an upset or bloated stomach.  Belching.  Chest pain.  Shortness of breath or wheezing.  Ongoing (chronic) cough or a night-time cough.  Wearing away of tooth enamel.  Weight loss. Different conditions can cause chest pain. Make sure to see your health care provider if you experience chest pain. DIAGNOSIS Your health care provider will take a medical history and perform a physical exam. To determine if you have mild or severe GERD, your health care provider may also monitor how you  respond to treatment. You may also have other tests, including:  An endoscopy toexamine your stomach and esophagus with a small camera.  A test thatmeasures the acidity level in your esophagus.  A test thatmeasures how much pressure is on your esophagus.  A barium swallow or modified barium swallow to show the shape, size, and functioning of your esophagus. TREATMENT The goal of treatment is to help relieve your symptoms and to prevent complications. Treatment for this condition may vary depending on how severe your symptoms are. Your health care provider may recommend:  Changes to your diet.  Medicine.  Surgery. HOME CARE INSTRUCTIONS Diet  Follow a diet as recommended by your health care provider. This may involve avoiding foods and drinks such as:  Coffee and tea (with or without caffeine).  Drinks that containalcohol.  Energy drinks and sports drinks.  Carbonated drinks or sodas.  Chocolate and cocoa.  Peppermint and mint flavorings.  Garlic and onions.  Horseradish.  Spicy and acidic foods, including peppers, chili powder, curry powder, vinegar, hot sauces, and barbecue sauce.  Citrus fruit juices and citrus fruits, such as oranges, lemons, and limes.  Tomato-based foods, such as red sauce, chili, salsa, and pizza with red sauce.  Fried and fatty foods, such as donuts, french fries, potato chips, and high-fat dressings.  High-fat meats, such as hot dogs and fatty cuts of red and white meats, such as rib eye steak, sausage, ham, and bacon.  High-fat dairy items, such as whole milk, butter, and cream cheese.  Eat small, frequent meals instead of large meals.  Avoid drinking large amounts of liquid with  your meals.  Avoid eating meals during the 2-3 hours before bedtime.  Avoid lying down right after you eat.  Do not exercise right after you eat. General Instructions  Pay attention to any changes in your symptoms.  Take over-the-counter and  prescription medicines only as told by your health care provider. Do not take aspirin, ibuprofen, or other NSAIDs unless your health care provider told you to do so.  Do not use any tobacco products, including cigarettes, chewing tobacco, and e-cigarettes. If you need help quitting, ask your health care provider.  Wear loose-fitting clothing. Do not wear anything tight around your waist that causes pressure on your abdomen.  Raise (elevate) the head of your bed 6 inches (15cm).  Try to reduce your stress, such as with yoga or meditation. If you need help reducing stress, ask your health care provider.  If you are overweight, reduce your weight to an amount that is healthy for you. Ask your health care provider for guidance about a safe weight loss goal.  Keep all follow-up visits as told by your health care provider. This is important. SEEK MEDICAL CARE IF:  You have new symptoms.  You have unexplained weight loss.  You have difficulty swallowing, or it hurts to swallow.  You have wheezing or a persistent cough.  Your symptoms do not improve with treatment.  You have a hoarse voice. SEEK IMMEDIATE MEDICAL CARE IF:  You have pain in your arms, neck, jaw, teeth, or back.  You feel sweaty, dizzy, or light-headed.  You have chest pain or shortness of breath.  You vomit and your vomit looks like blood or coffee grounds.  You faint.  Your stool is bloody or black.  You cannot swallow, drink, or eat.   This information is not intended to replace advice given to you by your health care provider. Make sure you discuss any questions you have with your health care provider.   Document Released: 09/20/2005 Document Revised: 09/01/2015 Document Reviewed: 04/07/2015 Elsevier Interactive Patient Education [employer]2016 [employer] Inc[employer]

## 2016-10-16 ENCOUNTER — Ambulatory Visit (INDEPENDENT_AMBULATORY_CARE_PROVIDER_SITE_OTHER): Payer: BLUE CROSS/BLUE SHIELD | Admitting: Family Medicine

## 2016-10-16 ENCOUNTER — Encounter: Payer: Self-pay | Admitting: Family Medicine

## 2016-10-16 VITALS — BP 130/84 | Temp 98.5°F | Ht 71.0 in | Wt 218.0 lb

## 2016-10-16 DIAGNOSIS — M546 Pain in thoracic spine: Secondary | ICD-10-CM | POA: Diagnosis not present

## 2016-10-16 MED ORDER — CHLORZOXAZONE 500 MG PO TABS: 500.0000 mg | ORAL_TABLET | Freq: Three times a day (TID) | ORAL | 1 refills | Status: DC | PRN

## 2016-10-16 NOTE — Progress Notes (Signed)
   Subjective:    Patient ID: Philip Hughes, male    DOB: [birth year], 30 y.o.   MRN: [id number]  Back Pain  This is a new problem. The current episode started today. Associated symptoms include chest pain. (Ear pain) Treatments tried: aspirin. The treatment provided moderate relief.    Sharp pain in upper baclk  Rad to front of chest  Was able to breate  Lasted tight for five or six minutes  reflux med hlping Went to nurses station and nurse out  Review of Systems  Cardiovascular: Positive for chest pain.  Musculoskeletal: Positive for back pain.       Objective:   Physical Exam Alert no apparent distress. HEENT normal lungs clear heart regular in rhythm. Some tenderness. Scapular left upper chest. No anterior chest pain.       Assessment & Plan:  Impression active pain/chest pain. Likely spasm discussed. Severe pain that struck with certain motions and/or deep breath. Plan add chlorzoxazone 2 current anti-inflammatories. Local measures discussed expect slow but steady resolution. Warning signs discussed

## 2016-12-29 DIAGNOSIS — R7309 Other abnormal glucose: Secondary | ICD-10-CM | POA: Diagnosis not present

## 2016-12-29 DIAGNOSIS — F17210 Nicotine dependence, cigarettes, uncomplicated: Secondary | ICD-10-CM | POA: Diagnosis not present

## 2017-01-12 DIAGNOSIS — F17210 Nicotine dependence, cigarettes, uncomplicated: Secondary | ICD-10-CM | POA: Diagnosis not present

## 2017-01-12 DIAGNOSIS — R7309 Other abnormal glucose: Secondary | ICD-10-CM | POA: Diagnosis not present

## 2017-01-18 ENCOUNTER — Encounter: Payer: Self-pay | Admitting: Nurse Practitioner

## 2017-01-18 ENCOUNTER — Encounter: Payer: Self-pay | Admitting: Family Medicine

## 2017-01-18 ENCOUNTER — Ambulatory Visit (INDEPENDENT_AMBULATORY_CARE_PROVIDER_SITE_OTHER): Payer: BLUE CROSS/BLUE SHIELD | Admitting: Nurse Practitioner

## 2017-01-18 VITALS — BP 122/82 | Temp 98.3°F | Wt 221.0 lb

## 2017-01-18 DIAGNOSIS — Z113 Encounter for screening for infections with a predominantly sexual mode of transmission: Secondary | ICD-10-CM | POA: Insufficient documentation

## 2017-01-18 DIAGNOSIS — J020 Streptococcal pharyngitis: Secondary | ICD-10-CM

## 2017-01-18 LAB — POCT RAPID STREP A (OFFICE): Rapid Strep A Screen: POSITIVE — AB

## 2017-01-18 MED ORDER — AZITHROMYCIN 250 MG PO TABS: ORAL_TABLET | ORAL | 0 refills | Status: DC

## 2017-01-18 NOTE — Progress Notes (Signed)
Subjective:  Presents for complaints of sore throat head congestion and low-grade fever for the past 2 days. Slight headache. Slight cough and runny nose. Slight ear pain. No wheezing. Had one episode of vomiting yesterday morning only. No diarrhea or abdominal pain. Occasional nausea. No rash. Taking fluids well. Voiding normal limit. Patient also requesting STD testing. Not currently sexually active. Has only had 1 partner in the past 6 months. Denies any dysuria or penile drainage.  Objective:   BP 122/82   Temp 98.3 F (36.8 C) (Oral)   Wt 221 lb (100.2 kg)   BMI 30.82 kg/m  NAD. Alert, oriented. TMs mild clear effusion, no erythema. Pharynx significant erythema, RST positive. Neck supple with mild soft anterior adenopathy. Lungs clear. Heart regular rate rhythm.  Assessment:  Strep pharyngitis - Plan: POCT rapid strep A  Screen for STD (sexually transmitted disease) - Plan: Chlamydia/Gonococcus/Trichomonas, NAA, HEP, RPR, HIV Panel, HEP, RPR, HIV Panel    Plan:  Meds ordered this encounter  Medications  . azithromycin (ZITHROMAX Z-PAK) 250 MG tablet    Sig: Take 2 tablets (500 mg) on  Day 1,  followed by 1 tablet (250 mg) once daily on Days 2 through 5.    Dispense:  6 each    Refill:  0    Order Specific Question:   Supervising Provider    Answer:   Merlyn AlbertLUKING, WILLIAM S [[id number]]   Reviewed symptomatic care and warning signs for strep throat. Call back if symptoms worsen or persist. Discussed safe sex issues.

## 2017-01-19 LAB — HEP, RPR, HIV PANEL
HIV Screen 4th Generation wRfx: NONREACTIVE
Hepatitis B Surface Ag: NEGATIVE
RPR: NONREACTIVE

## 2017-01-21 LAB — CHLAMYDIA/GONOCOCCUS/TRICHOMONAS, NAA
Chlamydia by NAA: NEGATIVE
Gonococcus by NAA: NEGATIVE
Trich vag by NAA: NEGATIVE

## 2017-01-26 DIAGNOSIS — Z6830 Body mass index (BMI) 30.0-30.9, adult: Secondary | ICD-10-CM | POA: Diagnosis not present

## 2017-01-26 DIAGNOSIS — F17210 Nicotine dependence, cigarettes, uncomplicated: Secondary | ICD-10-CM | POA: Diagnosis not present

## 2017-02-09 DIAGNOSIS — F17210 Nicotine dependence, cigarettes, uncomplicated: Secondary | ICD-10-CM | POA: Diagnosis not present

## 2017-02-09 DIAGNOSIS — E786 Lipoprotein deficiency: Secondary | ICD-10-CM | POA: Diagnosis not present

## 2017-02-09 DIAGNOSIS — R7309 Other abnormal glucose: Secondary | ICD-10-CM | POA: Diagnosis not present

## 2017-04-27 ENCOUNTER — Emergency Department (HOSPITAL_COMMUNITY)
Admission: EM | Admit: 2017-04-27 | Discharge: 2017-04-27 | Disposition: A | Discharge status: Home / Self Care | Payer: BLUE CROSS/BLUE SHIELD | Attending: Emergency Medicine | Admitting: Emergency Medicine

## 2017-04-27 ENCOUNTER — Encounter (HOSPITAL_COMMUNITY): Payer: Self-pay | Admitting: Emergency Medicine

## 2017-04-27 DIAGNOSIS — J029 Acute pharyngitis, unspecified: Secondary | ICD-10-CM | POA: Diagnosis not present

## 2017-04-27 DIAGNOSIS — Z87891 Personal history of nicotine dependence: Secondary | ICD-10-CM | POA: Diagnosis not present

## 2017-04-27 DIAGNOSIS — J020 Streptococcal pharyngitis: Secondary | ICD-10-CM | POA: Diagnosis not present

## 2017-04-27 LAB — RAPID STREP SCREEN (MED CTR MEBANE ONLY): Streptococcus, Group A Screen (Direct): POSITIVE — AB

## 2017-04-27 MED ORDER — DEXAMETHASONE 4 MG PO TABS
10.0000 mg | ORAL_TABLET | Freq: Once | ORAL | Status: AC
  Administered 2017-04-27: 10 mg via ORAL
  Filled 2017-04-27: qty 3

## 2017-04-27 MED ORDER — IBUPROFEN 800 MG PO TABS
800.0000 mg | ORAL_TABLET | Freq: Once | ORAL | Status: AC
  Administered 2017-04-27: 800 mg via ORAL
  Filled 2017-04-27: qty 1

## 2017-04-27 MED ORDER — PENICILLIN G BENZATHINE 1200000 UNIT/2ML IM SUSP
1.2000 10*6.[IU] | Freq: Once | INTRAMUSCULAR | Status: AC
  Administered 2017-04-27: 1.2 10*6.[IU] via INTRAMUSCULAR
  Filled 2017-04-27: qty 2

## 2017-04-27 NOTE — ED Triage Notes (Signed)
Pt c/o sore throat and states he may have run a fever.

## 2017-04-27 NOTE — ED Notes (Signed)
Pt states understanding of care given and follow up instructions.  No further questions at this time.  Pt a/o ambulated from ED with steady gait.

## 2017-04-27 NOTE — ED Provider Notes (Signed)
[facility] Provider Note   CSN: [id number] Arrival date & time: 04/27/17  1905     History   Chief Complaint Chief Complaint  Patient presents with  . Sore Throat    HPI Philip Hughes is a 31 y.o. male who presents to the ED with sore throat and fever.   The history is provided by the patient. No language interpreter was used.  Sore Throat  This is a new problem. The current episode started 12 to 24 hours ago. The problem occurs constantly. The problem has been gradually worsening. Pertinent negatives include no abdominal pain and no shortness of breath. Headaches: mild. The symptoms are aggravated by eating and drinking. Nothing relieves the symptoms. He has tried acetaminophen for the symptoms. The treatment provided no relief.    Past Medical History:  Diagnosis Date  . History of meningitis    age 89  . Migraines     Patient Active Problem List   Diagnosis Date Noted  . Screen for STD (sexually transmitted disease) 01/18/2017  . Patellar dislocation   . Acromioclavicular joint separation, type 2 02/04/2015    Past Surgical History:  Procedure Laterality Date  . KNEE ARTHROSCOPY Left 06/11/2015   Procedure: LEFT KNEE ARTHROSCOPY WITH LOOSE BODY REMOVAL;  Surgeon: Vickki HearingStanley E Harrison, MD;  Location: [facility];  Service: Orthopedics;  Laterality: Left;  . TONSILLECTOMY         Home Medications    Prior to Admission medications   Medication Sig Start Date End Date Taking? Authorizing Provider  albuterol (PROVENTIL HFA;VENTOLIN HFA) 108 (90 BASE) MCG/ACT inhaler Inhale 2 puffs into the lungs every 6 (six) hours as needed for wheezing or shortness of breath. 09/06/15   Merlyn AlbertWilliam S Luking, MD  azithromycin (ZITHROMAX Z-PAK) 250 MG tablet Take 2 tablets (500 mg) on  Day 1,  followed by 1 tablet (250 mg) once daily on Days 2 through 5. 01/18/17   Campbell Richesarolyn C Hoskins, NP  chlorzoxazone (PARAFON) 500 MG tablet Take 1 tablet (500 mg total) by mouth 3 (three) times daily as  needed for muscle spasms. 10/16/16   Merlyn AlbertWilliam S Luking, MD  pantoprazole (PROTONIX) 40 MG tablet Take 1 tablet (40 mg total) by mouth daily. 10/10/16 10/10/17  Merlyn AlbertWilliam S Luking, MD    Family History Family History  Problem Relation Age of Onset  . Diabetes Mother   . Asthma Mother   . Cancer Other   . Hypertension Other     Social History Social History  Substance Use Topics  . Smoking status: Former Smoker    Packs/day: 1.00    Years: 6.00    Types: Cigarettes    Quit date: 12/19/2016  . Smokeless tobacco: Never Used  . Alcohol use 3.0 - 3.6 oz/week    5 - 6 Cans of beer per week     Allergies   Patient has no known allergies.   Review of Systems Review of Systems  HENT: Positive for ear pain, sinus pressure and sore throat. Negative for dental problem.   Eyes: Negative for discharge, redness, itching and visual disturbance.  Respiratory: Negative for cough (dry), shortness of breath and wheezing.   Gastrointestinal: Negative for abdominal pain, diarrhea, nausea and vomiting.  Genitourinary: Negative for discharge, dysuria, frequency and urgency.  Musculoskeletal: Positive for back pain and neck pain. Negative for neck stiffness.  Neurological: Negative for syncope. Headaches: mild.  Psychiatric/Behavioral: Negative for confusion. The patient is not nervous/anxious.      Physical Exam  Updated Vital Signs BP 111/64 (BP Location: Left Arm)   Pulse (!) 101   Temp 99 F (37.2 C) (Oral)   Resp 18   Ht 5\' 11"  (1.803 m)   Wt 99.8 kg   BMI 30.68 kg/m   Physical Exam  Constitutional: He is oriented to person, place, and time. He appears well-developed and well-nourished. No distress.  HENT:  Head: Normocephalic and atraumatic.  Right Ear: Tympanic membrane normal.  Left Ear: Tympanic membrane normal.  Mouth/Throat: Uvula is midline and mucous membranes are normal. Uvula swelling present. Posterior oropharyngeal erythema present. No posterior oropharyngeal edema or  tonsillar abscesses.  Eyes: EOM are normal. Pupils are equal, round, and reactive to light.  Neck: Neck supple.  Cardiovascular: Regular rhythm.  Tachycardia present.   Pulmonary/Chest: Effort normal and breath sounds normal.  Abdominal: Soft. Bowel sounds are normal. There is no tenderness.  Musculoskeletal: Normal range of motion.  Lymphadenopathy:    He has cervical adenopathy.  Neurological: He is alert and oriented to person, place, and time. No cranial nerve deficit.  Skin: Skin is warm and dry.  Psychiatric: He has a normal mood and affect. His behavior is normal.  Nursing note and vitals reviewed.    ED Treatments / Results  Labs (all labs ordered are listed, but only abnormal results are displayed) Labs Reviewed  RAPID STREP SCREEN (NOT AT [facility]) - Abnormal; Notable for the following:       Result Value   Streptococcus, Group A Screen (Direct) POSITIVE (*)    All other components within normal limits    Radiology No results found.  Procedures Procedures (including critical care time)  Medications Ordered in ED Medications  penicillin g benzathine (BICILLIN LA) 1200000 UNIT/2ML injection 1.2 Million Units (not administered)  dexamethasone (DECADRON) tablet 10 mg (not administered)  ibuprofen (ADVIL,MOTRIN) tablet 800 mg (800 mg Oral Given 04/27/17 2011)     Initial Impression / Assessment and Plan / ED Course  I have reviewed the triage vital signs and the nursing notes.  Pertinent lab results that were available during my care of the patient were reviewed by me and considered in my medical decision making (see chart for details).   Final Clinical Impressions(s) / ED Diagnoses  31 y.o. male with sore throat and fever stable for d/c without difficulty swallowing, no tonsillar abscess. Strep screen positive. Patient treated with Penicillin IM and decadron 10 mg PO. He will take tylenol and ibuprofen as needed for fever and pain. He will f/u with his PCP or return  here for worsening symptoms. Return precautions discussed.  Final diagnoses:  Strep throat    New Prescriptions New Prescriptions   No medications on file     [facility], NP 04/27/17 2116    Bethann Berkshire, MD 04/27/17 [phone]

## 2017-04-27 NOTE — Discharge Instructions (Signed)
We have treated your strep throat with an injection of penicillin and have given you a one dose treatment of decadron to help with any swelling. You will need to take tylenol and ibuprofen as needed for pain and fever. Follow up with Dr. Gerda DissLuking or return here for worsening symptoms.

## 2017-04-30 ENCOUNTER — Encounter: Payer: Self-pay | Admitting: Family Medicine

## 2017-04-30 ENCOUNTER — Ambulatory Visit (INDEPENDENT_AMBULATORY_CARE_PROVIDER_SITE_OTHER): Payer: BLUE CROSS/BLUE SHIELD | Admitting: Family Medicine

## 2017-04-30 VITALS — BP 124/76 | Temp 98.1°F | Ht 71.0 in | Wt 223.0 lb

## 2017-04-30 DIAGNOSIS — J020 Streptococcal pharyngitis: Secondary | ICD-10-CM

## 2017-04-30 MED ORDER — FIRST-DUKES MOUTHWASH MT SUSP: OROMUCOSAL | 0 refills | Status: DC

## 2017-04-30 MED ORDER — CEFDINIR 300 MG PO CAPS: 300.0000 mg | ORAL_CAPSULE | Freq: Two times a day (BID) | ORAL | 0 refills | Status: DC

## 2017-04-30 NOTE — Progress Notes (Signed)
   Subjective:    Patient ID: Philip Hughes, male    DOB: [birth year], 31 y.o.   MRN: [id number]  HPI Follow up ED visit on 04/27/17. Strep screen positive. Treated with penicillin IM and decadron 10mg  po. Having sore throat, fever, ear pain.  Hit hard with strep  Sore throat persist  achey and fver and dim energy'  Took some cold and cong pills   No high fevers ongoing low-grade fever. Tick visiting penicillin injection which did not eradicate symptoms  Review of Systems No headache, no major weight loss or weight gain, no chest pain no back pain abdominal pain no change in bowel habits complete ROS otherwise negative     Objective:   Physical Exam Alert vitals stable, NAD. Blood pressure good on repeat. HEENT Pharynx remains erythematous with some tender anterior nodes left otherwise normal. Lungs clear. Heart regular rate and rhythm.        Assessment & Plan:  Impression persistent strep and lymphadenitis plan Omnicef twice a day 10 days. Symptom care discussed. Dukes Magic mouthwash warning signs discussed

## 2017-07-26 ENCOUNTER — Encounter: Payer: Self-pay | Admitting: Nurse Practitioner

## 2017-07-26 ENCOUNTER — Ambulatory Visit (INDEPENDENT_AMBULATORY_CARE_PROVIDER_SITE_OTHER): Payer: BLUE CROSS/BLUE SHIELD | Admitting: Nurse Practitioner

## 2017-07-26 VITALS — BP 132/84 | Temp 98.3°F | Ht 71.0 in | Wt 218.0 lb

## 2017-07-26 DIAGNOSIS — Z113 Encounter for screening for infections with a predominantly sexual mode of transmission: Secondary | ICD-10-CM

## 2017-07-26 MED ORDER — TRIAMCINOLONE ACETONIDE 0.1 % EX CREA: 1.0000 "application " | TOPICAL_CREAM | Freq: Two times a day (BID) | CUTANEOUS | 0 refills | Status: DC

## 2017-07-27 ENCOUNTER — Encounter: Payer: Self-pay | Admitting: Nurse Practitioner

## 2017-07-27 LAB — HIV ANTIBODY (ROUTINE TESTING W REFLEX): HIV SCREEN 4TH GENERATION: NONREACTIVE

## 2017-07-27 LAB — HEPATITIS C ANTIBODY

## 2017-07-27 LAB — RPR: RPR Ser Ql: NONREACTIVE

## 2017-07-27 NOTE — Progress Notes (Signed)
Subjective:  Presents requesting STD screening. A serious long-term relationship ended a few months ago. After this patient had multiple sexual partners, used condoms but states several times that they broke. Denies any dysuria or penile discharge. No testicular pain. Did have a knot, but the base of his penis at one point was slightly tender but this resolved and no further problems. No other rash. No fevers. No pelvic pain.  Objective:   BP 132/84   Temp 98.3 F (36.8 C) (Oral)   Ht 5\' 11"  (1.803 m)   Wt 218 lb (98.9 kg)   BMI 30.40 kg/m  NAD. Alert, oriented. Lungs clear. Heart regular rate rhythm. Patient defers GU exam since no symptoms at this time.  Assessment:  Screen for STD (sexually transmitted disease) - Plan: Chlamydia/Gonococcus/Trichomonas, NAA, HIV antibody, Hepatitis C antibody, RPR    Plan:  Reviewed standard STD testing, patient wishes for full screening at this point defers blood testing for HSV-2 since he is not symptomatic or has had known contact with someone with herpes. Further follow-up based on lab tests. Discussed safe sex issues.

## 2017-07-28 LAB — CHLAMYDIA/GONOCOCCUS/TRICHOMONAS, NAA
CHLAMYDIA BY NAA: NEGATIVE
GONOCOCCUS BY NAA: NEGATIVE
Trich vag by NAA: NEGATIVE

## 2017-09-05 ENCOUNTER — Encounter: Payer: Self-pay | Admitting: Nurse Practitioner

## 2017-09-05 ENCOUNTER — Ambulatory Visit (INDEPENDENT_AMBULATORY_CARE_PROVIDER_SITE_OTHER): Payer: BLUE CROSS/BLUE SHIELD | Admitting: Nurse Practitioner

## 2017-09-05 VITALS — BP 120/78 | Temp 98.1°F | Ht 71.0 in | Wt 216.2 lb

## 2017-09-05 DIAGNOSIS — L02415 Cutaneous abscess of right lower limb: Secondary | ICD-10-CM | POA: Diagnosis not present

## 2017-09-05 MED ORDER — DOXYCYCLINE HYCLATE 100 MG PO TABS: 100.0000 mg | ORAL_TABLET | Freq: Two times a day (BID) | ORAL | 0 refills | Status: DC

## 2017-09-05 MED ORDER — MUPIROCIN CALCIUM 2 % EX CREA: 1.0000 "application " | TOPICAL_CREAM | Freq: Two times a day (BID) | CUTANEOUS | 0 refills | Status: DC

## 2017-09-06 ENCOUNTER — Encounter: Payer: Self-pay | Admitting: Nurse Practitioner

## 2017-09-06 NOTE — Progress Notes (Signed)
Subjective:  Presents for complaints of a sore on his upper inner right thigh for the past 1-2 weeks. No fever. States it came to a head recently and he was able to express some white drainage. Since then area has become smaller. States a family member has had a similar type of rash.  Objective:   BP 120/78   Temp 98.1 F (36.7 C) (Oral)   Ht 5\' 11"  (1.803 m)   Wt 216 lb 3.2 oz (98.1 kg)   BMI 30.15 kg/m  NAD. Alert, oriented. Lungs clear. Heart regular rate rhythm. Superficial erythematous slightly tender lesion noted in the right upper inner thigh with a slightly raised white center and faint surrounding erythema approximately 2 cm in diameter. Area is mildly tender to palpation. Nasal mucosa mildly erythematous, no lesions.  Assessment:  Abscess of right thigh/possible MRSA    Plan:   Meds ordered this encounter  Medications  . doxycycline (VIBRA-TABS) 100 MG tablet    Sig: Take 1 tablet (100 mg total) by mouth 2 (two) times daily.    Dispense:  20 tablet    Refill:  0    Order Specific Question:   Supervising Provider    Answer:   Merlyn AlbertLUKING, WILLIAM S [[id number]]  . mupirocin cream (BACTROBAN) 2 %    Sig: Apply 1 application topically 2 (two) times daily.    Dispense:  15 g    Refill:  0    Order Specific Question:   Supervising Provider    Answer:   Merlyn AlbertLUKING, WILLIAM S [[id number]]   Apply Bactroban cream inside both nostrils twice a day for the next several days. Warm compresses to abscess. Warning signs reviewed. Call back in 5-7 days if no improvement, sooner if worse. Reviewed measures to prevent spread.

## 2017-09-28 ENCOUNTER — Ambulatory Visit: Payer: BLUE CROSS/BLUE SHIELD | Admitting: Nurse Practitioner

## 2017-10-01 ENCOUNTER — Encounter: Payer: Self-pay | Admitting: Family Medicine

## 2017-10-01 ENCOUNTER — Ambulatory Visit: Payer: BLUE CROSS/BLUE SHIELD | Admitting: Nurse Practitioner

## 2017-10-26 ENCOUNTER — Encounter: Payer: Self-pay | Admitting: Nurse Practitioner

## 2017-10-26 ENCOUNTER — Ambulatory Visit (INDEPENDENT_AMBULATORY_CARE_PROVIDER_SITE_OTHER): Payer: BLUE CROSS/BLUE SHIELD | Admitting: Nurse Practitioner

## 2017-10-26 ENCOUNTER — Ambulatory Visit (HOSPITAL_COMMUNITY)
Admission: RE | Admit: 2017-10-26 | Discharge: 2017-10-26 | Disposition: A | Discharge status: Home / Self Care | Payer: BLUE CROSS/BLUE SHIELD | Source: Ambulatory Visit | Attending: Nurse Practitioner | Admitting: Nurse Practitioner

## 2017-10-26 VITALS — BP 114/80 | Temp 98.2°F | Ht 71.0 in | Wt 214.0 lb

## 2017-10-26 DIAGNOSIS — J069 Acute upper respiratory infection, unspecified: Secondary | ICD-10-CM | POA: Diagnosis not present

## 2017-10-26 DIAGNOSIS — S0081XA Abrasion of other part of head, initial encounter: Secondary | ICD-10-CM

## 2017-10-26 DIAGNOSIS — S0083XA Contusion of other part of head, initial encounter: Secondary | ICD-10-CM

## 2017-10-26 DIAGNOSIS — W19XXXA Unspecified fall, initial encounter: Secondary | ICD-10-CM

## 2017-10-26 DIAGNOSIS — B9689 Other specified bacterial agents as the cause of diseases classified elsewhere: Secondary | ICD-10-CM | POA: Diagnosis not present

## 2017-10-26 DIAGNOSIS — Z23 Encounter for immunization: Secondary | ICD-10-CM | POA: Diagnosis not present

## 2017-10-26 DIAGNOSIS — X58XXXA Exposure to other specified factors, initial encounter: Secondary | ICD-10-CM | POA: Diagnosis not present

## 2017-10-26 DIAGNOSIS — J209 Acute bronchitis, unspecified: Secondary | ICD-10-CM | POA: Diagnosis not present

## 2017-10-26 DIAGNOSIS — S02401A Maxillary fracture, unspecified, initial encounter for closed fracture: Secondary | ICD-10-CM | POA: Diagnosis not present

## 2017-10-26 MED ORDER — AMOXICILLIN-POT CLAVULANATE 875-125 MG PO TABS: 1.0000 | ORAL_TABLET | Freq: Two times a day (BID) | ORAL | 0 refills | Status: DC

## 2017-10-27 ENCOUNTER — Encounter: Payer: Self-pay | Admitting: Nurse Practitioner

## 2017-10-27 NOTE — Progress Notes (Signed)
Subjective: Presents for complaints of sinus symptoms that began 4 days ago.  Low-grade fever.  Sore throat mainly with cough.  Runny nose.  Cough worse at nighttime, now producing green mucus.  Slight ear pain.  Slight chest pain/tightness with deep breath or cough.  2 days ago patient was on the back of a van during [employer] and fell off.  Scraped the right side of his face.  Some tenderness around the right eye but no visual changes and no actual pain of the eye itself.  Tetanus status unknown.  Objective:   BP 114/80   Temp 98.2 F (36.8 C) (Oral)   Ht 5\' 11"  (1.803 m)   Wt 214 lb (97.1 kg)   BMI 29.85 kg/m  NAD.  Alert, oriented.  TMs effusion, no erythema.  Pharynx erythematous with green PND noted.  Neck supple with mild soft anterior adenopathy.  Lungs scattered coarse expiratory crackles.  No wheezing or tachypnea.  Heart regular rate and rhythm.  The right side of the face has large superficial with some scabbing edema noted beneath the right eye, exquisitely tender to palpation.  There is no tenderness or swelling above the eye.  Small area of redness noted in the lateral part of the cornea.  Funduscopic exam normal.  Pupils reactive.  No evidence of corneal abrasion with tangential lighting.  Assessment:  Bacterial upper respiratory infection  Acute bronchitis, unspecified organism  Abrasion of face, initial encounter - Plan: Td vaccine greater than or equal to 7yo preservative free IM  Contusion of face, initial encounter - Plan: DG Facial Bones Complete    Plan:   Meds ordered this encounter  Medications  . amoxicillin-clavulanate (AUGMENTIN) 875-125 MG tablet    Sig: Take 1 tablet by mouth 2 (two) times daily.    Dispense:  20 tablet    Refill:  0    Order Specific Question:   Supervising Provider    Answer:   Merlyn AlbertLUKING, WILLIAM S [[id number]]   OTC meds as directed for congestion and symptomatic care.  Tetanus vaccine today.  Facial x-ray pending.  Call back next week if no  improvement, sooner if worse.  Reviewed warning signs including signs of facial infection from wounds.  Reviewed proper wound care.

## 2017-11-29 ENCOUNTER — Ambulatory Visit (INDEPENDENT_AMBULATORY_CARE_PROVIDER_SITE_OTHER): Payer: BLUE CROSS/BLUE SHIELD | Admitting: Nurse Practitioner

## 2017-11-29 ENCOUNTER — Encounter: Payer: Self-pay | Admitting: Nurse Practitioner

## 2017-11-29 VITALS — Ht 71.0 in | Wt 211.8 lb

## 2017-11-29 DIAGNOSIS — L739 Follicular disorder, unspecified: Secondary | ICD-10-CM

## 2017-11-29 MED ORDER — DOXYCYCLINE HYCLATE 100 MG PO TABS: 100.0000 mg | ORAL_TABLET | Freq: Two times a day (BID) | ORAL | 0 refills | Status: DC

## 2017-11-29 NOTE — Progress Notes (Signed)
Subjective: Presents for complaints of some pink bumps on his arms for the past 2-3 weeks.  Nonpruritic.  Nontender.  No fever.  No known contacts.  No changes in his hygiene products.  Was treated for an abscess on his right thigh on 9/12.  Objective:   Ht 5\' 11"  (1.803 m)   Wt 211 lb 12.8 oz (96.1 kg)   BMI 29.54 kg/m  NAD.  Alert, oriented.  Lungs clear.  Heart regular rate and rhythm.  Rare scattered faint pink papules on both arms.  Multiple discrete pink papules with a few tiny pustular lesions noted on the upper chest and back area.  One lesion noted on the right maxillary area.  Assessment:  Folliculitis    Plan:   Meds ordered this encounter  Medications  . doxycycline (VIBRA-TABS) 100 MG tablet    Sig: Take 1 tablet (100 mg total) by mouth 2 (two) times daily.    Dispense:  20 tablet    Refill:  0    Order Specific Question:   Supervising Provider    Answer:   Merlyn AlbertLUKING, WILLIAM S [[id number]]   Warning signs reviewed.  Call back if rash worsens or persists.

## 2018-01-28 ENCOUNTER — Encounter: Payer: Self-pay | Admitting: Family Medicine

## 2018-01-28 ENCOUNTER — Ambulatory Visit: Payer: BLUE CROSS/BLUE SHIELD | Admitting: Family Medicine

## 2018-01-28 VITALS — BP 118/76 | Ht 71.0 in | Wt 222.4 lb

## 2018-01-28 DIAGNOSIS — Z113 Encounter for screening for infections with a predominantly sexual mode of transmission: Secondary | ICD-10-CM | POA: Diagnosis not present

## 2018-01-28 DIAGNOSIS — L739 Follicular disorder, unspecified: Secondary | ICD-10-CM | POA: Diagnosis not present

## 2018-01-28 NOTE — Progress Notes (Signed)
   Subjective:    Patient ID: Philip Hughes, male    DOB: 02-03-1986, 32 y.o.   MRN: [id number]  HPI  Patient arrives for a follow up on folliculitis see prior note.  Had a substantial reduction of folliculitis.  To call his doxycycline.  Definitely helped some   and STD check(blood work only per patient)hx of interaction, see the discussion from last summer.  Concerned about particularly the fact HIV and syphilis reaction can occur later.  He would like to repeat these blood tests.  Since then has used safe sex.    +     Review of Systems No headache, no major weight loss or weight gain, no chest pain no back pain abdominal pain no change in bowel habits complete ROS otherwise negative     Objective:   Physical Exam  Alert vitals stable, NAD. Blood pressure good on repeat. HEENT normal. Lungs clear. Heart regular rate and rhythm. Skin several small pimple eruptions on the upper thorax several on forehead and cheek      Assessment & Plan:  Impression adult acne not  2.  Concerns regarding potential delayed STDs, will do blood work to honor patient's concerns  Not true folliculitis discussed  Greater than 50% of this 15 minute face to face visit was spent in counseling and discussion and coordination of care regarding the above diagnosis/diagnosies

## 2018-01-29 ENCOUNTER — Telehealth: Payer: Self-pay | Admitting: Family Medicine

## 2018-01-29 LAB — RPR: RPR Ser Ql: NONREACTIVE

## 2018-01-29 LAB — HIV ANTIBODY (ROUTINE TESTING W REFLEX): HIV SCREEN 4TH GENERATION: NONREACTIVE

## 2018-01-29 NOTE — Telephone Encounter (Signed)
Results returned perfect!

## 2018-01-29 NOTE — Telephone Encounter (Signed)
Pt called to check on lab results  Explained that the results are in but the doctor has not signed off for the nurse to call  Pt states home # [phone] is best number to reach him today

## 2018-01-29 NOTE — Telephone Encounter (Signed)
Patient is aware 

## 2018-02-13 DIAGNOSIS — E786 Lipoprotein deficiency: Secondary | ICD-10-CM | POA: Diagnosis not present

## 2018-02-13 DIAGNOSIS — Z716 Tobacco abuse counseling: Secondary | ICD-10-CM | POA: Diagnosis not present

## 2018-02-13 DIAGNOSIS — F17210 Nicotine dependence, cigarettes, uncomplicated: Secondary | ICD-10-CM | POA: Diagnosis not present

## 2018-02-13 DIAGNOSIS — Z008 Encounter for other general examination: Secondary | ICD-10-CM | POA: Diagnosis not present

## 2018-03-01 DIAGNOSIS — Z716 Tobacco abuse counseling: Secondary | ICD-10-CM | POA: Diagnosis not present

## 2018-03-01 DIAGNOSIS — H6090 Unspecified otitis externa, unspecified ear: Secondary | ICD-10-CM | POA: Diagnosis not present

## 2018-03-08 DIAGNOSIS — F17210 Nicotine dependence, cigarettes, uncomplicated: Secondary | ICD-10-CM | POA: Diagnosis not present

## 2018-08-28 DIAGNOSIS — F17211 Nicotine dependence, cigarettes, in remission: Secondary | ICD-10-CM | POA: Diagnosis not present

## 2018-08-28 DIAGNOSIS — Z6830 Body mass index (BMI) 30.0-30.9, adult: Secondary | ICD-10-CM | POA: Diagnosis not present

## 2018-08-28 DIAGNOSIS — Z008 Encounter for other general examination: Secondary | ICD-10-CM | POA: Diagnosis not present

## 2018-08-28 DIAGNOSIS — E786 Lipoprotein deficiency: Secondary | ICD-10-CM | POA: Diagnosis not present

## 2018-08-28 DIAGNOSIS — Z716 Tobacco abuse counseling: Secondary | ICD-10-CM | POA: Diagnosis not present

## 2018-10-18 ENCOUNTER — Ambulatory Visit: Payer: BLUE CROSS/BLUE SHIELD | Admitting: Family Medicine

## 2018-11-01 ENCOUNTER — Encounter: Payer: Self-pay | Admitting: Family Medicine

## 2018-11-01 ENCOUNTER — Ambulatory Visit (INDEPENDENT_AMBULATORY_CARE_PROVIDER_SITE_OTHER): Payer: Self-pay | Admitting: Family Medicine

## 2018-11-01 VITALS — Ht 71.0 in | Wt 213.8 lb

## 2018-11-01 DIAGNOSIS — R21 Rash and other nonspecific skin eruption: Secondary | ICD-10-CM

## 2018-11-01 NOTE — Progress Notes (Signed)
   Subjective:    Patient ID: Philip Hughes, male    DOB: 21-Oct-1986, 32 y.o.   MRN: [id number]  HPI  Patient arrives with a rash/bump on his right upper arm since summer. Patient states it does not itch or hurt but it has not went away. Patient also as a skin tag on his thigh   Has a spot on the right upper arm   No major symtoms with it   aggravatipn Now at [employer]     Review of Systems No headache, no major weight loss or weight gain, no chest pain no back pain abdominal pain no change in bowel habits complete ROS otherwise negative     Objective:   Physical Exam Alert vitals stable, NAD. Blood pressure good on repeat. HEENT normal. Lungs clear. Heart regular rate and rhythm. Right groin.  Small skin tag.  Right lateral arm.  Small circular erythematous nodule were cutaneous growth no obvious telangiectasia  Impression skin tag patient reassured  2.  Possible early basal cell skin cancer on.  Discussed we recommend elliptical and/or punch biopsy excision.  Rationale discussed patient to schedule       Assessment & Plan:

## 2019-01-08 ENCOUNTER — Encounter: Payer: Self-pay | Admitting: Family Medicine

## 2019-01-08 ENCOUNTER — Ambulatory Visit: Payer: BLUE CROSS/BLUE SHIELD | Admitting: Family Medicine

## 2019-01-08 VITALS — BP 132/80 | Ht 71.0 in | Wt 210.8 lb

## 2019-01-08 DIAGNOSIS — L989 Disorder of the skin and subcutaneous tissue, unspecified: Secondary | ICD-10-CM | POA: Diagnosis not present

## 2019-01-08 DIAGNOSIS — D2261 Melanocytic nevi of right upper limb, including shoulder: Secondary | ICD-10-CM | POA: Diagnosis not present

## 2019-01-08 NOTE — Progress Notes (Signed)
   Subjective:    Patient ID: Philip Hughes, male    DOB: Sep 02, 1986, 33 y.o.   MRN: [id number]  HPIpt arrives to follow up on possible early basal cell skin cancer on right upper arm. Was seen back in October and did not have insurance but now does and would like to get punch biopsy excision.     Review of Systems No headache, no major weight loss or weight gain, no chest pain no back pain abdominal pain no change in bowel habits complete ROS otherwise negative     Objective:   Physical Exam  Alert vitals stable, NAD. Blood pressure good on repeat. HEENT normal. Lungs clear. Heart regular rate and rhythm. Small erythematous pearly papule right lateral arm  Patient was prepped draped and anesthetized punch biopsy with 5 mm performed.  Repaired with 2 sutures.  Wound care discussed.  Biopsy sent for pathology.  Patient to get stitches out in 10 days from nurse at work      Assessment & Plan:

## 2019-01-13 ENCOUNTER — Telehealth: Payer: Self-pay | Admitting: *Deleted

## 2019-01-13 NOTE — Telephone Encounter (Signed)
error 

## 2019-01-13 NOTE — Telephone Encounter (Signed)
Pt seen last week for punch biopsy and Results from [facility] received and put in dr steve's folder for review.

## 2019-01-15 NOTE — Telephone Encounter (Signed)
Pt checking status of results from biopsy. Ok to leave detailed VM regarding results.   CB# [phone].

## 2019-01-16 NOTE — Telephone Encounter (Signed)
Left message to return call. Unable to leave on voicemail because someone answered phone and said he was at work.

## 2019-01-16 NOTE — Telephone Encounter (Signed)
Pt returned call and informed that pathology came back as a benign mole, with the margin edges free of involvement, no need for further intervention. Pt verbalized understanding.

## 2019-01-16 NOTE — Telephone Encounter (Signed)
Advise patient the pathology came back as a benign mole, with the margin edges free of involvement, so no need for further intervention

## 2019-01-31 DIAGNOSIS — Z6829 Body mass index (BMI) 29.0-29.9, adult: Secondary | ICD-10-CM | POA: Diagnosis not present

## 2019-01-31 DIAGNOSIS — L03111 Cellulitis of right axilla: Secondary | ICD-10-CM | POA: Diagnosis not present

## 2019-01-31 DIAGNOSIS — L739 Follicular disorder, unspecified: Secondary | ICD-10-CM | POA: Diagnosis not present

## 2019-03-25 ENCOUNTER — Ambulatory Visit (INDEPENDENT_AMBULATORY_CARE_PROVIDER_SITE_OTHER): Payer: Self-pay | Admitting: Family Medicine

## 2019-03-25 ENCOUNTER — Other Ambulatory Visit: Payer: Self-pay

## 2019-03-25 DIAGNOSIS — J329 Chronic sinusitis, unspecified: Secondary | ICD-10-CM

## 2019-03-25 MED ORDER — AMOXICILLIN-POT CLAVULANATE 875-125 MG PO TABS: 1.0000 | ORAL_TABLET | Freq: Two times a day (BID) | ORAL | 0 refills | Status: AC

## 2019-03-25 NOTE — Progress Notes (Signed)
   Subjective:    Patient ID: Philip Hughes, male    DOB: Oct 06, 1986, 33 y.o.   MRN: [id number]  Sinusitis  This is a new problem. Episode onset: one and a half days. There has been no fever. Associated symptoms include congestion, coughing and sinus pressure. (A little short of breath when coughing, drainage is green, joint pain off and on, no fever, scratchy throat) Treatments tried: mucinex. The treatment provided moderate relief.   Mother and brother both have same symptoms and were both diagnosed with URI last week.   Virtual Visit via Telephone Note  I connected with Philip Hughes on 03/25/19 at  2:00 PM EDT by telephone and verified that I am speaking with the correct person using two identifiers.   I discussed the limitations, risks, security and privacy concerns of performing an evaluation and management service by telephone and the availability of in person appointments. I also discussed with the patient that there may be a patient responsible charge related to this service. The patient expressed understanding and agreed to proceed.  Hit hard last two days, frontal h a , gunky discharge    History of Present Illness:    Observations/Objective:   Assessment and Plan:   Follow Up Instructions:    I discussed the assessment and treatment plan with the patient. The patient was provided an opportunity to ask questions and all were answered. The patient agreed with the plan and demonstrated an understanding of the instructions.   The patient was advised to call back or seek an in-person evaluation if the symptoms worsen or if the condition fails to improve as anticipated.  I provided minutes of non-face-to-face time during this encounter.   Kyra Manges, LPN    Review of Systems  HENT: Positive for congestion and sinus pressure.   Respiratory: Positive for cough.        Objective:   Physical Exam   No exam due to virtual visit     Assessment &  Plan:  Impression viral syndrome with elements of sinusitis.  Others in the family have been sick.  Similar illness.  Cannot say with certainty whether patient has chronic virus or not.  Patient educated at length on potential progression of symptoms and when to go to the emergency room.  Antibiotics prescribed

## 2019-06-13 ENCOUNTER — Ambulatory Visit (INDEPENDENT_AMBULATORY_CARE_PROVIDER_SITE_OTHER): Payer: BC Managed Care – PPO | Admitting: Family Medicine

## 2019-06-13 ENCOUNTER — Encounter: Payer: Self-pay | Admitting: Family Medicine

## 2019-06-13 ENCOUNTER — Other Ambulatory Visit: Payer: Self-pay

## 2019-06-13 DIAGNOSIS — L739 Follicular disorder, unspecified: Secondary | ICD-10-CM | POA: Diagnosis not present

## 2019-06-13 MED ORDER — DOXYCYCLINE HYCLATE 100 MG PO TABS: 100.0000 mg | ORAL_TABLET | Freq: Two times a day (BID) | ORAL | 0 refills | Status: DC

## 2019-06-13 NOTE — Progress Notes (Signed)
   Subjective:    Patient ID: Philip Hughes, male    DOB: April 04, 1986, 33 y.o.   MRN: [id number] Audio only HPI Patient calls to discuss a rash under his right armpit. The rash arrived earlier this year and it ended up turning into boils after he tried to pop them. Yesterday the patient noticed the bumps had returned under his arm    Virtual Visit via Video Note  I connected with Philip Hughes on 06/13/19 at 10:00 AM EDT by a video enabled telemedicine application and verified that I am speaking with the correct person using two identifiers.  Location: Patient: home Provider: office   I discussed the limitations of evaluation and management by telemedicine and the availability of in person appointments. The patient expressed understanding and agreed to proceed.  History of Present Illness:    Observations/Objective:   Assessment and Plan:   Follow Up Instructions:    I discussed the assessment and treatment plan with the patient. The patient was provided an opportunity to ask questions and all were answered. The patient agreed with the plan and demonstrated an understanding of the instructions.   The patient was advised to call back or seek an in-person evaluation if the symptoms worsen or if the condition fails to improve as anticipated.  I provided 18 minutes of non-face-to-face time during this encounter.   Kicked in with m   Review of Systems No headache, no major weight loss or weight gain, no chest pain no back pain abdominal pain no change in bowel habits complete ROS otherwise negative     Objective:   Physical Exam   Virtual visit     Assessment & Plan:  Impression folliculitis eruption axillary region.  History of boils in the past.  Patient had multiple questions about hidradenitis of vertebral local measures discussed antibiotics prescribed

## 2019-06-20 ENCOUNTER — Other Ambulatory Visit: Payer: Self-pay

## 2019-06-20 ENCOUNTER — Encounter: Payer: Self-pay | Admitting: Family Medicine

## 2019-06-20 ENCOUNTER — Ambulatory Visit (INDEPENDENT_AMBULATORY_CARE_PROVIDER_SITE_OTHER): Payer: BC Managed Care – PPO | Admitting: Family Medicine

## 2019-06-20 VITALS — Temp 98.4°F | Wt 217.0 lb

## 2019-06-20 DIAGNOSIS — L739 Follicular disorder, unspecified: Secondary | ICD-10-CM | POA: Diagnosis not present

## 2019-06-20 DIAGNOSIS — Z113 Encounter for screening for infections with a predominantly sexual mode of transmission: Secondary | ICD-10-CM

## 2019-06-20 NOTE — Progress Notes (Signed)
   Subjective:    Patient ID: Philip Hughes, male    DOB: 03-26-1986, 33 y.o.   MRN: [id number]  HPIpt would like to get STD testing done today.  Very nice gentleman he has reoccurring problems with infections underneath his arm pit areas.  Mainly folliculitis.  Sometimes turns into small nodules.  Does not have any abscesses with this does not have to have surgical issues with this because of these issues the patient is concerned that he could be having developing superlative hidradenitis  The patient is also concerned of potential exposures to STDs but he does not have any specific concerns regarding this He does not feel he has GC or chlamydia and does not want any testing for that  Review of Systems  Constitutional: Negative for activity change, fatigue and fever.  HENT: Negative for congestion and rhinorrhea.   Respiratory: Negative for cough and shortness of breath.   Cardiovascular: Negative for chest pain and leg swelling.  Gastrointestinal: Negative for abdominal pain, diarrhea and nausea.  Genitourinary: Negative for dysuria and hematuria.  Neurological: Negative for weakness and headaches.  Psychiatric/Behavioral: Negative for agitation and behavioral problems.       Objective:   Physical Exam  Lungs clear respiratory rate normal heart regular no murmurs extremities no edema skin warm dry underneath the right arm.  There is a rash very minimal folliculitis no need for antibiotics currently supportive measures and warm compresses recommended      Assessment & Plan:  Possible exposure to sexually transmitted disease patient states this is because of previous habits he would like to have lab testing  The areas under his arm overall appear normal patient concerned about the possibility of hidradenitis superlative I find no evidence at this point we will give him information

## 2019-06-24 ENCOUNTER — Telehealth: Payer: Self-pay | Admitting: Family Medicine

## 2019-06-24 NOTE — Telephone Encounter (Signed)
Pt checking to see if lab results are back from 6/26

## 2019-06-24 NOTE — Telephone Encounter (Signed)
Left message to return call 

## 2019-06-24 NOTE — Telephone Encounter (Signed)
Pt returned call. Informed patient that labs are not back completley and once provider signs off on them we will notify pt. Pt verbalized understanding

## 2019-06-25 LAB — VDRL: Non-Treponemal Screening VDRL: NORMAL

## 2019-06-25 LAB — HIV ANTIBODY (ROUTINE TESTING W REFLEX): HIV Screen 4th Generation wRfx: NONREACTIVE

## 2019-10-09 ENCOUNTER — Telehealth: Payer: Self-pay | Admitting: Family Medicine

## 2019-10-09 ENCOUNTER — Encounter: Payer: Self-pay | Admitting: Family Medicine

## 2019-10-09 ENCOUNTER — Ambulatory Visit (INDEPENDENT_AMBULATORY_CARE_PROVIDER_SITE_OTHER): Payer: BC Managed Care – PPO | Admitting: Family Medicine

## 2019-10-09 ENCOUNTER — Other Ambulatory Visit: Payer: Self-pay

## 2019-10-09 DIAGNOSIS — M272 Inflammatory conditions of jaws: Secondary | ICD-10-CM

## 2019-10-09 MED ORDER — PENICILLIN V POTASSIUM 500 MG PO TABS: 500.0000 mg | ORAL_TABLET | Freq: Four times a day (QID) | ORAL | 0 refills | Status: DC

## 2019-10-09 MED ORDER — HYDROCODONE-ACETAMINOPHEN 5-325 MG PO TABS: ORAL_TABLET | ORAL | 0 refills | Status: DC

## 2019-10-09 NOTE — Telephone Encounter (Signed)
Pt.notified

## 2019-10-09 NOTE — Telephone Encounter (Signed)
Pt called to check status of the antibiotics & pain meds that we were going to order for him  He had a virtual visit with Dr. Richardson Landry around 10:00 this morning & the pharmacy hasn't received anything  Please advise & call pt when done    [facility]

## 2019-10-09 NOTE — Telephone Encounter (Signed)
Pt called back to check on scripts. Please advise. Thank you

## 2019-10-09 NOTE — Progress Notes (Signed)
   Subjective:  Audio only  Patient ID: Philip Hughes, male    DOB: 1986/05/16, 33 y.o.   MRN: [id number]  HPIwoke up at 3 am with tooth hurting. Pt would like to get antibiotic for abscessed tooth. Pt also requesting a work note for today.   Virtual Visit via Telephone Note  I connected with Philip Hughes on 10/09/19 at 10:00 AM EDT by telephone and verified that I am speaking with the correct person using two identifiers.  Location: Patient: home Provider: office   I discussed the limitations, risks, security and privacy concerns of performing an evaluation and management service by telephone and the availability of in person appointments. I also discussed with the patient that there may be a patient responsible charge related to this service. The patient expressed understanding and agreed to proceed.   History of Present Illness:    Observations/Objective:   Assessment and Plan:   Follow Up Instructions:    I discussed the assessment and treatment plan with the patient. The patient was provided an opportunity to ask questions and all were answered. The patient agreed with the plan and demonstrated an understanding of the instructions.   The patient was advised to call back or seek an in-person evaluation if the symptoms worsen or if the condition fails to improve as anticipated.  I provided 16 minutes of non-face-to-face time during this encounter.  Patient has contacted his tenderness cannot see into next week.  Substantial painful tooth.  No fever but localized tenderness and pain.  Aching fairly severely per patient.  Has experienced 1 of these before and was helped in the past with antibiotics.     Review of Systems No headache no chest pain no cough    Objective:   Physical Exam   Virtual visit     Assessment & Plan:  Impression odontogenic infection.  Strongly encouraged to see dentist.  Penicillin 4 times daily for 10 days.  Pain medicine prescribed  symptom care discussed warning signs discussed

## 2019-10-09 NOTE — Telephone Encounter (Signed)
I was able to go into the chart from earlier today and signed this prescriptions to be sent to his pharmacy thank you

## 2019-10-09 NOTE — Telephone Encounter (Signed)
Scripts are pended for hydrocodone and penicillin. Dr Richardson Landry did not sign before leaving. Can you sign?

## 2019-10-11 ENCOUNTER — Encounter: Payer: Self-pay | Admitting: Family Medicine

## 2019-11-19 ENCOUNTER — Ambulatory Visit: Payer: BC Managed Care – PPO | Admitting: Family Medicine

## 2019-11-19 ENCOUNTER — Other Ambulatory Visit: Payer: Self-pay

## 2019-11-19 DIAGNOSIS — K146 Glossodynia: Secondary | ICD-10-CM | POA: Diagnosis not present

## 2019-11-19 DIAGNOSIS — Z6830 Body mass index (BMI) 30.0-30.9, adult: Secondary | ICD-10-CM | POA: Diagnosis not present

## 2020-01-26 ENCOUNTER — Encounter: Payer: Self-pay | Admitting: Family Medicine

## 2020-02-26 DIAGNOSIS — R079 Chest pain, unspecified: Secondary | ICD-10-CM | POA: Diagnosis not present

## 2020-02-26 DIAGNOSIS — E162 Hypoglycemia, unspecified: Secondary | ICD-10-CM | POA: Diagnosis not present

## 2020-02-26 DIAGNOSIS — E161 Other hypoglycemia: Secondary | ICD-10-CM | POA: Diagnosis not present

## 2020-02-26 DIAGNOSIS — R0789 Other chest pain: Secondary | ICD-10-CM | POA: Diagnosis not present

## 2020-03-22 ENCOUNTER — Ambulatory Visit (INDEPENDENT_AMBULATORY_CARE_PROVIDER_SITE_OTHER): Payer: BC Managed Care – PPO | Admitting: Family Medicine

## 2020-03-22 DIAGNOSIS — K047 Periapical abscess without sinus: Secondary | ICD-10-CM

## 2020-03-22 MED ORDER — HYDROCODONE-ACETAMINOPHEN 5-325 MG PO TABS: ORAL_TABLET | ORAL | 0 refills | Status: DC

## 2020-03-22 MED ORDER — PENICILLIN V POTASSIUM 500 MG PO TABS: 500.0000 mg | ORAL_TABLET | Freq: Four times a day (QID) | ORAL | 0 refills | Status: DC

## 2020-03-22 NOTE — Progress Notes (Signed)
   Subjective:    Patient ID: Philip Hughes, male    DOB: 01-30-1986, 34 y.o.   MRN: [id number]  HPI Patient calls today for an infected tooth. Pain started yesterday. Patient states during the day its not too bad at night severe pain. He has tried Ibuprofen with little to no improvement. Pt could not get in with his dentist. Virtual Visit via Video Note  I connected with Philip Hughes on 03/22/20 at 11:00 AM EDT by a video enabled telemedicine application and verified that I am speaking with the correct person using two identifiers.  Location: Patient: home Provider: office    I discussed the limitations of evaluation and management by telemedicine and the availability of in person appointments. The patient expressed understanding and agreed to proceed.  History of Present Illness:    Observations/Objective:   Assessment and Plan:   Follow Up Instructions:    I discussed the assessment and treatment plan with the patient. The patient was provided an opportunity to ask questions and all were answered. The patient agreed with the plan and demonstrated an understanding of the instructions.   The patient was advised to call back or seek an in-person evaluation if the symptoms worsen or if the condition fails to improve as anticipated.  I provided 10 minutes of non-face-to-face time during this encounter.     Review of Systems     Objective:   Physical Exam   Virtual visit unable to do exam     Assessment & Plan:  Broken tooth with infection recommend penicillin next 7 days Vicodin as needed for severe pain caution drowsiness not for frequent use follow-up if ongoing troubles go see dentist early April as planned

## 2020-05-20 ENCOUNTER — Other Ambulatory Visit: Payer: Self-pay

## 2020-05-20 ENCOUNTER — Telehealth (INDEPENDENT_AMBULATORY_CARE_PROVIDER_SITE_OTHER): Payer: Self-pay | Admitting: Family Medicine

## 2020-05-20 DIAGNOSIS — M272 Inflammatory conditions of jaws: Secondary | ICD-10-CM

## 2020-05-20 MED ORDER — HYDROCODONE-ACETAMINOPHEN 5-325 MG PO TABS: ORAL_TABLET | ORAL | 0 refills | Status: DC

## 2020-05-20 MED ORDER — PENICILLIN V POTASSIUM 500 MG PO TABS: 500.0000 mg | ORAL_TABLET | Freq: Three times a day (TID) | ORAL | 0 refills | Status: DC

## 2020-05-20 NOTE — Progress Notes (Signed)
   Subjective:  Audio only  Patient ID: Philip Hughes, male    DOB: 1986/12/24, 34 y.o.   MRN: [id number]  Dental Pain  This is a new problem. Episode onset: 2 days. Episode frequency: subsides during the day for the most part but at night constantly hurts. Patient is in the middle of having dental work done, implant put in a few weeks ago.  Treatments tried: ibuprofen. The treatment provided no relief.  Patient tried to contact dentist and was given an appointment for tomorrow.  Virtual Visit via Video Note  I connected with Philip Hughes on 05/20/20 at  3:30 PM EDT by a video enabled telemedicine application and verified that I am speaking with the correct person using two identifiers.  Location: Patient: home Provider: office   I discussed the limitations of evaluation and management by telemedicine and the availability of in person appointments. The patient expressed understanding and agreed to proceed.  History of Present Illness:    Observations/Objective:   Assessment and Plan:   Follow Up Instructions:    I discussed the assessment and treatment plan with the patient. The patient was provided an opportunity to ask questions and all were answered. The patient agreed with the plan and demonstrated an understanding of the instructions.   The patient was advised to call back or seek an in-person evaluation if the symptoms worsen or if the condition fails to improve as anticipated.  I provided 10 minutes of non-face-to-face time during this encounter.  Gums swollen.  Positive substantial pain and tenderness with movement.  And chewing.  Very painful last night and this morning.  History of odontogenic infections  Recent instrumentation of the jaws with a dental implant.  Review of Systems No fever no chills    Objective:   Physical Exam   Virtual     Assessment & Plan:  Impression probable odontogenic infection with secondary pain Pen-Vee K 500 7010 days.   Hydrocodone.  For pain.  Encouraged to follow-up with tenderness symptom care discussed

## 2020-11-30 DIAGNOSIS — D2362 Other benign neoplasm of skin of left upper limb, including shoulder: Secondary | ICD-10-CM | POA: Diagnosis not present

## 2020-11-30 DIAGNOSIS — I781 Nevus, non-neoplastic: Secondary | ICD-10-CM | POA: Diagnosis not present

## 2020-11-30 DIAGNOSIS — D2361 Other benign neoplasm of skin of right upper limb, including shoulder: Secondary | ICD-10-CM | POA: Diagnosis not present

## 2022-05-18 ENCOUNTER — Ambulatory Visit: Payer: Self-pay | Admitting: Family Medicine

## 2022-08-03 ENCOUNTER — Ambulatory Visit: Payer: BC Managed Care – PPO | Admitting: Family Medicine

## 2022-08-03 ENCOUNTER — Encounter: Payer: Self-pay | Admitting: Family Medicine

## 2022-08-03 VITALS — BP 119/72 | HR 88 | Temp 98.1°F | Ht 71.0 in | Wt 222.0 lb

## 2022-08-03 DIAGNOSIS — R632 Polyphagia: Secondary | ICD-10-CM | POA: Diagnosis not present

## 2022-08-03 DIAGNOSIS — R1013 Epigastric pain: Secondary | ICD-10-CM

## 2022-08-03 DIAGNOSIS — Z6830 Body mass index (BMI) 30.0-30.9, adult: Secondary | ICD-10-CM | POA: Diagnosis not present

## 2022-08-03 MED ORDER — PANTOPRAZOLE SODIUM 40 MG PO TBEC: 40.0000 mg | DELAYED_RELEASE_TABLET | Freq: Every day | ORAL | 3 refills | Status: DC

## 2022-08-03 NOTE — Progress Notes (Signed)
Subjective:  Patient ID: Philip Hughes, male    DOB: 1985-12-31, 36 y.o.   MRN: [id number]  Patient Care Team: Baruch Gouty, FNP as PCP - General (Family Medicine)   Chief Complaint:  New Patient (Initial Visit) (Establish care/Pain below breast plate, slightly left side, front and back/1st time couple years ago, most recently 2 months ago//)   HPI: Philip Hughes is a 36 y.o. male presenting on 08/03/2022 for New Patient (Initial Visit) (Establish care/Pain below breast plate, slightly left side, front and back/1st time couple years ago, most recently 2 months ago//)   Pt presents today to establish care with new PCP and for the evaluation of epigastric pain which radiated to his back. He reports this has happened in the past and he was placed on Protonix which helped, he has not taken this in a long time. States the other say he was in bed and developed a sharp pain in epigastric area that radiated to his back. He felt nauseous and diaphoretic. States this lasted for a few minutes and subsided. Denies recurrent pain but does have a dull ache at times. He denies eating spicy, greasy, or fatty foods prior to going to bed. Does admit to excessive soda intake. States he has also noticed an increased appetite over the last several months. Denies chest pain, shortness of breath, palpitations, dizziness, syncope, orthopnea, PND, leg swelling, vomiting, or diarrhea. No known sick contacts. No changes in stool color.   Abdominal Pain The current episode started 1 to 4 weeks ago. The onset quality is sudden. The problem has been resolved. The pain is located in the epigastric region. The pain is at a severity of 7/10. The pain is moderate. The quality of the pain is sharp and aching. The abdominal pain radiates to the back. Associated symptoms include nausea. Pertinent negatives include no anorexia, arthralgias, belching, constipation, diarrhea, dysuria, fever, flatus, frequency, headaches,  hematochezia, hematuria, melena, myalgias, vomiting or weight loss. Nothing aggravates the pain. The pain is relieved by Nothing.    Relevant past medical, surgical, family, and social history reviewed and updated as indicated.  Allergies and medications reviewed and updated. Data reviewed: Chart in Epic.   Past Medical History:  Diagnosis Date   History of meningitis    age 35   Migraines     Past Surgical History:  Procedure Laterality Date   KNEE ARTHROSCOPY Left 06/11/2015   Procedure: LEFT KNEE ARTHROSCOPY WITH LOOSE BODY REMOVAL;  Surgeon: Carole Civil, MD;  Location: [facility];  Service: Orthopedics;  Laterality: Left;   TONSILLECTOMY      Social History   Socioeconomic History   Marital status: Single    Spouse name: Not on file   Number of children: Not on file   Years of education: Not on file   Highest education level: Not on file  Occupational History   Not on file  Tobacco Use   Smoking status: Former    Packs/day: 1.00    Years: 6.00    Total pack years: 6.00    Types: Cigarettes    Quit date: 12/19/2016    Years since quitting: 5.6   Smokeless tobacco: Never  Substance and Sexual Activity   Alcohol use: Yes    Alcohol/week: 5.0 - 6.0 standard drinks of alcohol    Types: 5 - 6 Cans of beer per week   Drug use: No   Sexual activity: Yes    Birth control/protection: None  Other Topics Concern   Not on file  Social History Narrative   Not on file   Social Determinants of Health   Financial Resource Strain: Not on file  Food Insecurity: Not on file  Transportation Needs: Not on file  Physical Activity: Not on file  Stress: Not on file  Social Connections: Not on file  Intimate Partner Violence: Not on file    Outpatient Encounter Medications as of 08/03/2022  Medication Sig   pantoprazole (PROTONIX) 40 MG tablet Take 1 tablet (40 mg total) by mouth daily.   [DISCONTINUED] HYDROcodone-acetaminophen (NORCO/VICODIN) 5-325 MG tablet Take one  every 4 -6 hours prn pain   [DISCONTINUED] penicillin v potassium (VEETID) 500 MG tablet Take 1 tablet (500 mg total) by mouth 3 (three) times daily.   No facility-administered encounter medications on file as of 08/03/2022.    No Known Allergies  Review of Systems  Constitutional:  Positive for appetite change and diaphoresis. Negative for activity change, chills, fatigue, fever, unexpected weight change and weight loss.  HENT: Negative.    Eyes: Negative.  Negative for photophobia and visual disturbance.  Respiratory:  Negative for cough, chest tightness and shortness of breath.   Cardiovascular:  Negative for chest pain, palpitations and leg swelling.  Gastrointestinal:  Positive for abdominal pain and nausea. Negative for abdominal distention, anal bleeding, anorexia, blood in stool, constipation, diarrhea, flatus, hematochezia, melena, rectal pain and vomiting.  Endocrine: Positive for polyphagia. Negative for cold intolerance, heat intolerance, polydipsia and polyuria.  Genitourinary:  Negative for decreased urine volume, difficulty urinating, dysuria, frequency, hematuria and urgency.  Musculoskeletal:  Negative for arthralgias and myalgias.  Skin: Negative.   Allergic/Immunologic: Negative.   Neurological:  Negative for dizziness, tremors, seizures, syncope, facial asymmetry, speech difficulty, weakness, light-headedness, numbness and headaches.  Hematological: Negative.   Psychiatric/Behavioral:  Negative for confusion, hallucinations, sleep disturbance and suicidal ideas.   All other systems reviewed and are negative.       Objective:  BP 119/72   Pulse 88   Temp 98.1 F (36.7 C)   Ht _0  (1.803 m)   Wt 222 lb (100.7 kg)   SpO2 94%   BMI 30.96 kg/m    Wt Readings from Last 3 Encounters:  08/03/22 222 lb (100.7 kg)  06/20/19 217 lb (98.4 kg)  01/08/19 210 lb 12.8 oz (95.6 kg)    Physical Exam Vitals and nursing note reviewed.  Constitutional:       Appearance: Normal appearance. He is obese. He is not ill-appearing, toxic-appearing or diaphoretic.  HENT:     Head: Normocephalic and atraumatic.     Mouth/Throat:     Mouth: Mucous membranes are moist.     Dentition: Abnormal dentition (missing teeth). Dental caries present.  Eyes:     Conjunctiva/sclera: Conjunctivae normal.     Pupils: Pupils are equal, round, and reactive to light.  Cardiovascular:     Rate and Rhythm: Normal rate and regular rhythm.     Pulses: Normal pulses.     Heart sounds: Normal heart sounds. No murmur heard.    No friction rub. No gallop.  Pulmonary:     Effort: Pulmonary effort is normal.     Breath sounds: Normal breath sounds.  Abdominal:     General: Abdomen is flat. Bowel sounds are normal. There is no distension or abdominal bruit.     Palpations: Abdomen is soft.     Tenderness: There is no abdominal tenderness.     Hernia: No  hernia is present.  Musculoskeletal:     Right lower leg: No edema.     Left lower leg: No edema.  Skin:    General: Skin is warm and dry.     Capillary Refill: Capillary refill takes less than 2 seconds.     Coloration: Skin is not jaundiced or pale.     Findings: No bruising or rash.  Neurological:     General: No focal deficit present.     Mental Status: He is alert and oriented to person, place, and time.  Psychiatric:        Mood and Affect: Mood normal.        Behavior: Behavior normal.        Thought Content: Thought content normal.        Judgment: Judgment normal.     Results for orders placed or performed in visit on 06/20/19  HIV Antibody (routine testing w rflx)  Result Value Ref Range   HIV Screen 4th Generation wRfx Non Reactive Non Reactive  VDRL, Serum  Result Value Ref Range   Non-Treponemal Screening VDRL Normal      EKG: SR, 77, PR 140 ms, QT 362 ms, incomplete BBB, LVH pattern, no acute ST-T changes or ectopy, no prior EKG for comparison. Monia Pouch, FNP-C  Pertinent labs & imaging  results that were available during my care of the patient were reviewed by me and considered in my medical decision making.  Assessment & Plan:  Philip Hughes was seen today for new patient (initial visit).  Diagnoses and all orders for this visit:  Epigastric pain Differentials include, but are not limited to, cholecystitis, pancreatitis, GERD, angina, or liver disease. EKG without acute findings. Labs pending. Will reinitiate PPI therapy. Further treatment pending results.  -     CMP14+EGFR -     CBC with Differential/Platelet -     Thyroid Panel With TSH -     Amylase -     Lipase -     Bayer DCA Hb A1c Waived -     EKG 12-Lead -     pantoprazole (PROTONIX) 40 MG tablet; Take 1 tablet (40 mg total) by mouth daily.  BMI 30.0-30.9,adult Labs pending. Diet and exercise encouraged.  -     CMP14+EGFR -     CBC with Differential/Platelet -     Thyroid Panel With TSH -     Bayer DCA Hb A1c Waived  Increased appetite Concerning for possible metabolic disorder. Labs pending.  -     CMP14+EGFR -     CBC with Differential/Platelet -     Thyroid Panel With TSH -     Bayer DCA Hb A1c Waived     Continue all other maintenance medications.  Follow up plan: Return in about 4 weeks (around 08/31/2022), or if symptoms worsen or fail to improve, for epigastric pain.   Continue healthy lifestyle choices, including diet (rich in fruits, vegetables, and lean proteins, and low in salt and simple carbohydrates) and exercise (at least 30 minutes of moderate physical activity daily).  Educational handout given for GERD  The above assessment and management plan was discussed with the patient. The patient verbalized understanding of and has agreed to the management plan. Patient is aware to call the clinic if they develop any new symptoms or if symptoms persist or worsen. Patient is aware when to return to the clinic for a follow-up visit. Patient educated on when it is appropriate to go to the emergency  department.   Monia Pouch, FNP-C [facility] [phone]

## 2022-08-04 LAB — CBC WITH DIFFERENTIAL/PLATELET
Basophils Absolute: 0 10*3/uL (ref 0.0–0.2)
Basos: 1 %
EOS (ABSOLUTE): 0.3 10*3/uL (ref 0.0–0.4)
Eos: 4 %
Hematocrit: 45.2 % (ref 37.5–51.0)
Hemoglobin: 15.2 g/dL (ref 13.0–17.7)
Immature Grans (Abs): 0 10*3/uL (ref 0.0–0.1)
Immature Granulocytes: 0 %
Lymphocytes Absolute: 1.9 10*3/uL (ref 0.7–3.1)
Lymphs: 30 %
MCH: 31 pg (ref 26.6–33.0)
MCHC: 33.6 g/dL (ref 31.5–35.7)
MCV: 92 fL (ref 79–97)
Monocytes Absolute: 0.6 10*3/uL (ref 0.1–0.9)
Monocytes: 10 %
Neutrophils Absolute: 3.4 10*3/uL (ref 1.4–7.0)
Neutrophils: 55 %
Platelets: 238 10*3/uL (ref 150–450)
RBC: 4.91 x10E6/uL (ref 4.14–5.80)
RDW: 12.6 % (ref 11.6–15.4)
WBC: 6.3 10*3/uL (ref 3.4–10.8)

## 2022-08-04 LAB — CMP14+EGFR
ALT: 42 IU/L (ref 0–44)
AST: 29 IU/L (ref 0–40)
Albumin/Globulin Ratio: 1.7 (ref 1.2–2.2)
Albumin: 4.2 g/dL (ref 4.1–5.1)
Alkaline Phosphatase: 83 IU/L (ref 44–121)
BUN/Creatinine Ratio: 14 (ref 9–20)
BUN: 13 mg/dL (ref 6–20)
Bilirubin Total: 0.4 mg/dL (ref 0.0–1.2)
CO2: 22 mmol/L (ref 20–29)
Calcium: 9 mg/dL (ref 8.7–10.2)
Chloride: 102 mmol/L (ref 96–106)
Creatinine, Ser: 0.93 mg/dL (ref 0.76–1.27)
Globulin, Total: 2.5 g/dL (ref 1.5–4.5)
Glucose: 91 mg/dL (ref 70–99)
Potassium: 4.2 mmol/L (ref 3.5–5.2)
Sodium: 138 mmol/L (ref 134–144)
Total Protein: 6.7 g/dL (ref 6.0–8.5)
eGFR: 111 mL/min/{1.73_m2} (ref >59)

## 2022-08-04 LAB — LIPASE: Lipase: 78 U/L (ref 13–78)

## 2022-08-04 LAB — AMYLASE: Amylase: 56 U/L (ref 31–110)

## 2022-08-04 LAB — THYROID PANEL WITH TSH
Free Thyroxine Index: 2 (ref 1.2–4.9)
T3 Uptake Ratio: 29 % (ref 24–39)
T4, Total: 6.9 ug/dL (ref 4.5–12.0)
TSH: 0.944 u[IU]/mL (ref 0.450–4.500)

## 2022-08-04 LAB — BAYER DCA HB A1C WAIVED: HB A1C (BAYER DCA - WAIVED): 5.3 % (ref 4.8–5.6)

## 2022-08-17 ENCOUNTER — Ambulatory Visit: Payer: BC Managed Care – PPO | Admitting: Family Medicine

## 2022-08-17 ENCOUNTER — Encounter: Payer: Self-pay | Admitting: Family Medicine

## 2022-08-17 VITALS — BP 109/70 | HR 86 | Temp 98.8°F | Ht 71.0 in | Wt 214.0 lb

## 2022-08-17 DIAGNOSIS — R112 Nausea with vomiting, unspecified: Secondary | ICD-10-CM

## 2022-08-17 DIAGNOSIS — R197 Diarrhea, unspecified: Secondary | ICD-10-CM

## 2022-08-17 MED ORDER — ONDANSETRON HCL 4 MG PO TABS: 4.0000 mg | ORAL_TABLET | Freq: Three times a day (TID) | ORAL | 0 refills | Status: DC | PRN

## 2022-08-17 NOTE — Progress Notes (Signed)
Subjective:  Patient ID: Philip Hughes, male    DOB: September 12, 1986, 36 y.o.   MRN: [id number]  Patient Care Team: Baruch Gouty, FNP as PCP - General (Family Medicine)   Chief Complaint:  food poisoning   HPI: Philip Hughes is a 36 y.o. male presenting on 08/17/2022 for food poisoning   Pt presents today with complaints of n/v/d that started last night after eating at [facility]. No associated fever, chills, weakness, or confusion. Urine output normal. Denies melena or hematochezia.   GI Problem The primary symptoms include nausea, vomiting and diarrhea. Primary symptoms do not include fever, weight loss, fatigue, abdominal pain, melena, hematemesis, jaundice, hematochezia, dysuria, myalgias, arthralgias or rash. The illness began yesterday. The onset was sudden.  Nausea began yesterday. The nausea is associated with eating.  The vomiting began yesterday. The emesis contains stomach contents.  The illness does not include chills, anorexia, dysphagia, odynophagia, bloating, constipation, tenesmus, back pain or itching.     Relevant past medical, surgical, family, and social history reviewed and updated as indicated.  Allergies and medications reviewed and updated. Data reviewed: Chart in Epic.   Past Medical History:  Diagnosis Date   History of meningitis    age 46   Migraines     Past Surgical History:  Procedure Laterality Date   KNEE ARTHROSCOPY Left 06/11/2015   Procedure: LEFT KNEE ARTHROSCOPY WITH LOOSE BODY REMOVAL;  Surgeon: Carole Civil, MD;  Location: [facility];  Service: Orthopedics;  Laterality: Left;   TONSILLECTOMY      Social History   Socioeconomic History   Marital status: Single    Spouse name: Not on file   Number of children: Not on file   Years of education: Not on file   Highest education level: Not on file  Occupational History   Not on file  Tobacco Use   Smoking status: Former    Packs/day: 1.00    Years: 6.00    Total pack  years: 6.00    Types: Cigarettes    Quit date: 12/19/2016    Years since quitting: 5.6   Smokeless tobacco: Never  Substance and Sexual Activity   Alcohol use: Yes    Alcohol/week: 5.0 - 6.0 standard drinks of alcohol    Types: 5 - 6 Cans of beer per week   Drug use: No   Sexual activity: Yes    Birth control/protection: None  Other Topics Concern   Not on file  Social History Narrative   Not on file   Social Determinants of Health   Financial Resource Strain: Not on file  Food Insecurity: Not on file  Transportation Needs: Not on file  Physical Activity: Not on file  Stress: Not on file  Social Connections: Not on file  Intimate Partner Violence: Not on file    Outpatient Encounter Medications as of 08/17/2022  Medication Sig   ondansetron (ZOFRAN) 4 MG tablet Take 1 tablet (4 mg total) by mouth every 8 (eight) hours as needed for nausea or vomiting.   pantoprazole (PROTONIX) 40 MG tablet Take 1 tablet (40 mg total) by mouth daily.   No facility-administered encounter medications on file as of 08/17/2022.    No Known Allergies  Review of Systems  Constitutional:  Positive for appetite change. Negative for activity change, chills, diaphoresis, fatigue, fever, unexpected weight change and weight loss.  Respiratory:  Negative for cough and shortness of breath.   Cardiovascular:  Negative for chest pain, palpitations  and leg swelling.  Gastrointestinal:  Positive for diarrhea, nausea and vomiting. Negative for abdominal distention, abdominal pain, anal bleeding, anorexia, bloating, blood in stool, constipation, dysphagia, hematemesis, hematochezia, jaundice, melena and rectal pain.  Genitourinary:  Negative for decreased urine volume, difficulty urinating and dysuria.  Musculoskeletal:  Negative for arthralgias, back pain and myalgias.  Skin:  Negative for itching and rash.  Neurological:  Negative for dizziness, weakness, light-headedness and headaches.   Psychiatric/Behavioral:  Negative for confusion.   All other systems reviewed and are negative.       Objective:  BP 109/70   Pulse (!) 115   Temp 98.8 F (37.1 C)   Ht _0  (1.803 m)   Wt 214 lb (97.1 kg)   SpO2 97%   BMI 29.85 kg/m    Wt Readings from Last 3 Encounters:  08/17/22 214 lb (97.1 kg)  08/03/22 222 lb (100.7 kg)  06/20/19 217 lb (98.4 kg)    Physical Exam Vitals and nursing note reviewed.  Constitutional:      General: He is not in acute distress.    Appearance: Normal appearance. He is obese. He is not ill-appearing, toxic-appearing or diaphoretic.  HENT:     Head: Normocephalic and atraumatic.     Nose: Nose normal.     Mouth/Throat:     Mouth: Mucous membranes are moist.  Eyes:     Conjunctiva/sclera: Conjunctivae normal.     Pupils: Pupils are equal, round, and reactive to light.  Cardiovascular:     Rate and Rhythm: Normal rate and regular rhythm.     Heart sounds: Normal heart sounds. No murmur heard.    No friction rub. No gallop.  Pulmonary:     Effort: Pulmonary effort is normal.     Breath sounds: Normal breath sounds.  Abdominal:     General: Abdomen is flat. Bowel sounds are increased. There is no distension.     Palpations: Abdomen is soft. There is no mass.     Tenderness: There is no abdominal tenderness. There is no right CVA tenderness, left CVA tenderness, guarding or rebound.     Hernia: No hernia is present.  Musculoskeletal:     Right lower leg: No edema.     Left lower leg: No edema.  Skin:    General: Skin is warm and dry.     Capillary Refill: Capillary refill takes less than 2 seconds.  Neurological:     General: No focal deficit present.     Mental Status: He is alert and oriented to person, place, and time.  Psychiatric:        Mood and Affect: Mood normal.        Behavior: Behavior normal.        Thought Content: Thought content normal.        Judgment: Judgment normal.     Results for orders placed or  performed in visit on 08/03/22  CMP14+EGFR  Result Value Ref Range   Glucose 91 70 - 99 mg/dL   BUN 13 6 - 20 mg/dL   Creatinine, Ser 0.93 0.76 - 1.27 mg/dL   eGFR 111 >59 mL/min/1.73   BUN/Creatinine Ratio 14 9 - 20   Sodium 138 134 - 144 mmol/L   Potassium 4.2 3.5 - 5.2 mmol/L   Chloride 102 96 - 106 mmol/L   CO2 22 20 - 29 mmol/L   Calcium 9.0 8.7 - 10.2 mg/dL   Total Protein 6.7 6.0 - 8.5 g/dL   Albumin  4.2 4.1 - 5.1 g/dL   Globulin, Total 2.5 1.5 - 4.5 g/dL   Albumin/Globulin Ratio 1.7 1.2 - 2.2   Bilirubin Total 0.4 0.0 - 1.2 mg/dL   Alkaline Phosphatase 83 44 - 121 IU/L   AST 29 0 - 40 IU/L   ALT 42 0 - 44 IU/L  CBC with Differential/Platelet  Result Value Ref Range   WBC 6.3 3.4 - 10.8 x10E3/uL   RBC 4.91 4.14 - 5.80 x10E6/uL   Hemoglobin 15.2 13.0 - 17.7 g/dL   Hematocrit 45.2 37.5 - 51.0 %   MCV 92 79 - 97 fL   MCH 31.0 26.6 - 33.0 pg   MCHC 33.6 31.5 - 35.7 g/dL   RDW 12.6 11.6 - 15.4 %   Platelets 238 150 - 450 x10E3/uL   Neutrophils 55 Not Estab. %   Lymphs 30 Not Estab. %   Monocytes 10 Not Estab. %   Eos 4 Not Estab. %   Basos 1 Not Estab. %   Neutrophils Absolute 3.4 1.4 - 7.0 x10E3/uL   Lymphocytes Absolute 1.9 0.7 - 3.1 x10E3/uL   Monocytes Absolute 0.6 0.1 - 0.9 x10E3/uL   EOS (ABSOLUTE) 0.3 0.0 - 0.4 x10E3/uL   Basophils Absolute 0.0 0.0 - 0.2 x10E3/uL   Immature Granulocytes 0 Not Estab. %   Immature Grans (Abs) 0.0 0.0 - 0.1 x10E3/uL  Thyroid Panel With TSH  Result Value Ref Range   TSH 0.944 0.450 - 4.500 uIU/mL   T4, Total 6.9 4.5 - 12.0 ug/dL   T3 Uptake Ratio 29 24 - 39 %   Free Thyroxine Index 2.0 1.2 - 4.9  Amylase  Result Value Ref Range   Amylase 56 31 - 110 U/L  Lipase  Result Value Ref Range   Lipase 78 13 - 78 U/L  Bayer DCA Hb A1c Waived  Result Value Ref Range   HB A1C (BAYER DCA - WAIVED) 5.3 4.8 - 5.6 %       Pertinent labs & imaging results that were available during my care of the patient were reviewed by me and  considered in my medical decision making.  Assessment & Plan:  Mar was seen today for food poisoning.  Diagnoses and all orders for this visit:  Nausea vomiting and diarrhea Onset last night after eating at [facility]. Will provide zofran. Symptomatic care discussed in detail. Aware to report new, worsening, or persistent symptoms.  -     ondansetron (ZOFRAN) 4 MG tablet; Take 1 tablet (4 mg total) by mouth every 8 (eight) hours as needed for nausea or vomiting.     Continue all other maintenance medications.  Follow up plan: Return if symptoms worsen or fail to improve.   Continue healthy lifestyle choices, including diet (rich in fruits, vegetables, and lean proteins, and low in salt and simple carbohydrates) and exercise (at least 30 minutes of moderate physical activity daily).  Educational handout given for N/V/D  The above assessment and management plan was discussed with the patient. The patient verbalized understanding of and has agreed to the management plan. Patient is aware to call the clinic if they develop any new symptoms or if symptoms persist or worsen. Patient is aware when to return to the clinic for a follow-up visit. Patient educated on when it is appropriate to go to the emergency department.   Monia Pouch, FNP-C [facility] [phone]

## 2022-09-05 ENCOUNTER — Encounter: Payer: Self-pay | Admitting: Family Medicine

## 2022-09-05 ENCOUNTER — Ambulatory Visit: Payer: BC Managed Care – PPO | Admitting: Family Medicine

## 2022-09-05 ENCOUNTER — Ambulatory Visit (INDEPENDENT_AMBULATORY_CARE_PROVIDER_SITE_OTHER): Payer: BC Managed Care – PPO

## 2022-09-05 VITALS — BP 122/71 | HR 84 | Temp 98.1°F | Ht 71.0 in | Wt 224.0 lb

## 2022-09-05 DIAGNOSIS — R1013 Epigastric pain: Secondary | ICD-10-CM

## 2022-09-05 DIAGNOSIS — K219 Gastro-esophageal reflux disease without esophagitis: Secondary | ICD-10-CM | POA: Diagnosis not present

## 2022-09-05 DIAGNOSIS — K5900 Constipation, unspecified: Secondary | ICD-10-CM

## 2022-09-05 MED ORDER — POLYETHYLENE GLYCOL 3350 17 GM/SCOOP PO POWD: 17.0000 g | Freq: Every day | ORAL | 1 refills | Status: DC

## 2022-09-05 NOTE — Progress Notes (Signed)
Subjective:  Patient ID: Philip Hughes, male    DOB: Feb 29, 1986, 36 y.o.   MRN: [id number]  Patient Care Team: Baruch Gouty, FNP as PCP - General (Family Medicine)   Chief Complaint:  4 week f/u epigastric pain   HPI: Philip Hughes is a 35 y.o. male presenting on 09/05/2022 for 4 week f/u epigastric pain   Gastroesophageal Reflux He complains of abdominal pain and belching. He reports no chest pain, no choking, no coughing, no dysphagia, no early satiety, no globus sensation, no heartburn, no hoarse voice, no nausea, no sore throat, no stridor, no tooth decay, no water brash or no wheezing. This is a recurrent problem. The current episode started more than 1 month ago. The problem occurs frequently. The problem has been waxing and waning. The symptoms are aggravated by certain foods and lying down. Pertinent negatives include no anemia, fatigue, melena, muscle weakness, orthopnea or weight loss. Risk factors include lack of exercise, caffeine use and obesity. He has tried a PPI for the symptoms. The treatment provided significant relief.  Symptoms have improved greatly with PPO therapy but are still present. Denies hemoptysis, melena, hematochezia, voice changes, dysphagia, sore throat, or cough.    Relevant past medical, surgical, family, and social history reviewed and updated as indicated.  Allergies and medications reviewed and updated. Data reviewed: Chart in Epic.   Past Medical History:  Diagnosis Date   History of meningitis    age 68   Migraines     Past Surgical History:  Procedure Laterality Date   KNEE ARTHROSCOPY Left 06/11/2015   Procedure: LEFT KNEE ARTHROSCOPY WITH LOOSE BODY REMOVAL;  Surgeon: Carole Civil, MD;  Location: [facility];  Service: Orthopedics;  Laterality: Left;   TONSILLECTOMY      Social History   Socioeconomic History   Marital status: Single    Spouse name: Not on file   Number of children: Not on file   Years of education: Not on  file   Highest education level: Not on file  Occupational History   Not on file  Tobacco Use   Smoking status: Former    Packs/day: 1.00    Years: 6.00    Total pack years: 6.00    Types: Cigarettes    Quit date: 12/19/2016    Years since quitting: 5.7   Smokeless tobacco: Never  Substance and Sexual Activity   Alcohol use: Yes    Alcohol/week: 5.0 - 6.0 standard drinks of alcohol    Types: 5 - 6 Cans of beer per week   Drug use: No   Sexual activity: Yes    Birth control/protection: None  Other Topics Concern   Not on file  Social History Narrative   Not on file   Social Determinants of Health   Financial Resource Strain: Not on file  Food Insecurity: Not on file  Transportation Needs: Not on file  Physical Activity: Not on file  Stress: Not on file  Social Connections: Not on file  Intimate Partner Violence: Not on file    Outpatient Encounter Medications as of 09/05/2022  Medication Sig   ondansetron (ZOFRAN) 4 MG tablet Take 1 tablet (4 mg total) by mouth every 8 (eight) hours as needed for nausea or vomiting.   pantoprazole (PROTONIX) 40 MG tablet Take 1 tablet (40 mg total) by mouth daily.   polyethylene glycol powder (GLYCOLAX/MIRALAX) 17 GM/SCOOP powder Take 17 g by mouth daily.   No facility-administered encounter medications  on file as of 09/05/2022.    No Known Allergies  Review of Systems  Constitutional:  Negative for activity change, appetite change, chills, diaphoresis, fatigue, fever, unexpected weight change and weight loss.  HENT: Negative.  Negative for hoarse voice and sore throat.   Eyes: Negative.   Respiratory:  Negative for cough, choking, chest tightness, shortness of breath and wheezing.   Cardiovascular:  Negative for chest pain, palpitations and leg swelling.  Gastrointestinal:  Positive for abdominal pain. Negative for abdominal distention, anal bleeding, blood in stool, constipation, diarrhea, dysphagia, heartburn, melena, nausea and  vomiting.  Endocrine: Negative.   Genitourinary:  Negative for decreased urine volume, difficulty urinating, dysuria, frequency and urgency.  Musculoskeletal:  Negative for arthralgias, myalgias and muscle weakness.  Skin: Negative.   Allergic/Immunologic: Negative.   Neurological:  Negative for dizziness, tremors, seizures, syncope, facial asymmetry, speech difficulty, weakness, light-headedness, numbness and headaches.  Hematological: Negative.   Psychiatric/Behavioral:  Negative for confusion, hallucinations, sleep disturbance and suicidal ideas.   All other systems reviewed and are negative.       Objective:  BP 122/71   Pulse 84   Temp 98.1 F (36.7 C)   Ht _0  (1.803 m)   Wt 224 lb (101.6 kg)   SpO2 97%   BMI 31.24 kg/m    Wt Readings from Last 3 Encounters:  09/05/22 224 lb (101.6 kg)  08/17/22 214 lb (97.1 kg)  08/03/22 222 lb (100.7 kg)    Physical Exam Vitals and nursing note reviewed.  Constitutional:      General: He is not in acute distress.    Appearance: Normal appearance. He is well-developed and well-groomed. He is obese. He is not ill-appearing, toxic-appearing or diaphoretic.  HENT:     Head: Normocephalic and atraumatic.     Jaw: There is normal jaw occlusion.     Right Ear: Hearing normal.     Left Ear: Hearing normal.     Nose: Nose normal.     Mouth/Throat:     Lips: Pink.     Mouth: Mucous membranes are moist.     Dentition: Abnormal dentition.     Pharynx: Oropharynx is clear. Uvula midline.  Eyes:     General: Lids are normal.     Extraocular Movements: Extraocular movements intact.     Conjunctiva/sclera: Conjunctivae normal.     Pupils: Pupils are equal, round, and reactive to light.  Neck:     Thyroid: No thyroid mass, thyromegaly or thyroid tenderness.     Vascular: No carotid bruit or JVD.     Trachea: Trachea and phonation normal.  Cardiovascular:     Rate and Rhythm: Normal rate and regular rhythm.     Chest Wall: PMI is  not displaced.     Pulses: Normal pulses.     Heart sounds: Normal heart sounds. No murmur heard.    No friction rub. No gallop.  Pulmonary:     Effort: Pulmonary effort is normal. No respiratory distress.     Breath sounds: Normal breath sounds. No wheezing.  Abdominal:     General: Bowel sounds are normal. There is no abdominal bruit.     Palpations: Abdomen is soft. There is no hepatomegaly or splenomegaly.     Tenderness: There is no abdominal tenderness.  Musculoskeletal:        General: Normal range of motion.     Cervical back: Normal range of motion and neck supple.     Right lower leg: No  edema.     Left lower leg: No edema.  Lymphadenopathy:     Cervical: No cervical adenopathy.  Skin:    General: Skin is warm and dry.     Capillary Refill: Capillary refill takes less than 2 seconds.     Coloration: Skin is not cyanotic, jaundiced or pale.     Findings: No rash.  Neurological:     General: No focal deficit present.     Mental Status: He is alert and oriented to person, place, and time.     Sensory: Sensation is intact.     Motor: Motor function is intact.     Coordination: Coordination is intact.     Gait: Gait is intact.     Deep Tendon Reflexes: Reflexes are normal and symmetric.  Psychiatric:        Attention and Perception: Attention and perception normal.        Mood and Affect: Mood and affect normal.        Speech: Speech normal.        Behavior: Behavior normal. Behavior is cooperative.        Thought Content: Thought content normal.        Cognition and Memory: Cognition and memory normal.        Judgment: Judgment normal.     Results for orders placed or performed in visit on 08/03/22  CMP14+EGFR  Result Value Ref Range   Glucose 91 70 - 99 mg/dL   BUN 13 6 - 20 mg/dL   Creatinine, Ser 0.93 0.76 - 1.27 mg/dL   eGFR 111 >59 mL/min/1.73   BUN/Creatinine Ratio 14 9 - 20   Sodium 138 134 - 144 mmol/L   Potassium 4.2 3.5 - 5.2 mmol/L   Chloride 102  96 - 106 mmol/L   CO2 22 20 - 29 mmol/L   Calcium 9.0 8.7 - 10.2 mg/dL   Total Protein 6.7 6.0 - 8.5 g/dL   Albumin 4.2 4.1 - 5.1 g/dL   Globulin, Total 2.5 1.5 - 4.5 g/dL   Albumin/Globulin Ratio 1.7 1.2 - 2.2   Bilirubin Total 0.4 0.0 - 1.2 mg/dL   Alkaline Phosphatase 83 44 - 121 IU/L   AST 29 0 - 40 IU/L   ALT 42 0 - 44 IU/L  CBC with Differential/Platelet  Result Value Ref Range   WBC 6.3 3.4 - 10.8 x10E3/uL   RBC 4.91 4.14 - 5.80 x10E6/uL   Hemoglobin 15.2 13.0 - 17.7 g/dL   Hematocrit 45.2 37.5 - 51.0 %   MCV 92 79 - 97 fL   MCH 31.0 26.6 - 33.0 pg   MCHC 33.6 31.5 - 35.7 g/dL   RDW 12.6 11.6 - 15.4 %   Platelets 238 150 - 450 x10E3/uL   Neutrophils 55 Not Estab. %   Lymphs 30 Not Estab. %   Monocytes 10 Not Estab. %   Eos 4 Not Estab. %   Basos 1 Not Estab. %   Neutrophils Absolute 3.4 1.4 - 7.0 x10E3/uL   Lymphocytes Absolute 1.9 0.7 - 3.1 x10E3/uL   Monocytes Absolute 0.6 0.1 - 0.9 x10E3/uL   EOS (ABSOLUTE) 0.3 0.0 - 0.4 x10E3/uL   Basophils Absolute 0.0 0.0 - 0.2 x10E3/uL   Immature Granulocytes 0 Not Estab. %   Immature Grans (Abs) 0.0 0.0 - 0.1 x10E3/uL  Thyroid Panel With TSH  Result Value Ref Range   TSH 0.944 0.450 - 4.500 uIU/mL   T4, Total 6.9 4.5 - 12.0 ug/dL  T3 Uptake Ratio 29 24 - 39 %   Free Thyroxine Index 2.0 1.2 - 4.9  Amylase  Result Value Ref Range   Amylase 56 31 - 110 U/L  Lipase  Result Value Ref Range   Lipase 78 13 - 78 U/L  Bayer DCA Hb A1c Waived  Result Value Ref Range   HB A1C (BAYER DCA - WAIVED) 5.3 4.8 - 5.6 %     X-Ray: KUB: Moderate stool burden. No acute findings. Preliminary x-ray reading by Monia Pouch, FNP-C, [facility].   Pertinent labs & imaging results that were available during my care of the patient were reviewed by me and considered in my medical decision making.  Assessment & Plan:  Philip Hughes was seen today for 4 week f/u epigastric pain.  Diagnoses and all orders for this visit:  Gastroesophageal reflux  disease without esophagitis Epigastric abdominal pain Continue PPI therapy. Will check for H. Pylori due to ongoing symptoms. KUB with incidental finding of constipation.  -     DG Abd 1 View -     H Pylori, IGM, IGG, IGA AB  Constipation in male Noted on KUB. Miralax cleanout discussed in detail.  -     polyethylene glycol powder (GLYCOLAX/MIRALAX) 17 GM/SCOOP powder; Take 17 g by mouth daily.     Continue all other maintenance medications.  Follow up plan: Return in about 3 months (around 12/05/2022), or if symptoms worsen or fail to improve, for GERD.   Continue healthy lifestyle choices, including diet (rich in fruits, vegetables, and lean proteins, and low in salt and simple carbohydrates) and exercise (at least 30 minutes of moderate physical activity daily).  Educational handout given for GERD, miralax clean out  The above assessment and management plan was discussed with the patient. The patient verbalized understanding of and has agreed to the management plan. Patient is aware to call the clinic if they develop any new symptoms or if symptoms persist or worsen. Patient is aware when to return to the clinic for a follow-up visit. Patient educated on when it is appropriate to go to the emergency department.   Monia Pouch, FNP-C [facility] [phone]

## 2022-09-05 NOTE — Patient Instructions (Signed)
Thank you for coming in to clinic today.  1. Your symptoms are consistent with Constipation, likely cause of your General Abdominal Pain / Cramping. 2. Start with Miralax, prescription was sent to pharmacy. First dose 68g (4 capfuls) in 32oz water over 1 to 2 hours for clean out. Next day start 17g or 1 capful daily, may adjust dose up or down by half a capful every few days. Recommend to take this medicine daily for next 1-2 weeks, you may need to use it longer if needed. - Goal is to have soft regular bowel movement 1-3x daily, if too runny or diarrhea, then reduce dose of the medicine to every other day.  Improve water intake, hydration will help Also recommend increased vegetables, fruits, fiber intake Can try daily Metamucil or Fiber supplement at pharmacy over the counter  Follow-up if symptoms are not improving with bowel movements, or if pain worsens, develop fevers, nausea, vomiting.  Please schedule a follow-up appointment with Michelle Jossilyn Benda, FNP, in 1 month to follow-up Constipation  If you have any other questions or concerns, please feel free to call the clinic to contact me. You may also schedule an earlier appointment if necessary.  However, if your symptoms get significantly worse, please go to the Emergency Department to seek immediate medical attention.  

## 2022-09-07 LAB — H PYLORI, IGM, IGG, IGA AB
H pylori, IgM Abs: <9 units (ref 0.0–8.9)
H. pylori, IgA Abs: <9 units (ref 0.0–8.9)
H. pylori, IgG AbS: 0.38 Index Value (ref 0.00–0.79)

## 2022-09-27 ENCOUNTER — Telehealth: Payer: Self-pay | Admitting: Family Medicine

## 2022-09-27 NOTE — Telephone Encounter (Signed)
Called patient and gave him lab results from 09/05/2022

## 2022-10-11 ENCOUNTER — Encounter: Payer: Self-pay | Admitting: Nurse Practitioner

## 2022-10-11 ENCOUNTER — Encounter: Payer: Self-pay | Admitting: Family Medicine

## 2022-10-11 ENCOUNTER — Ambulatory Visit (INDEPENDENT_AMBULATORY_CARE_PROVIDER_SITE_OTHER): Payer: BC Managed Care – PPO | Admitting: Nurse Practitioner

## 2022-10-11 VITALS — BP 118/74 | HR 90 | Temp 99.9°F | Ht 68.0 in | Wt 218.0 lb

## 2022-10-11 DIAGNOSIS — Z202 Contact with and (suspected) exposure to infections with a predominantly sexual mode of transmission: Secondary | ICD-10-CM | POA: Diagnosis not present

## 2022-10-11 DIAGNOSIS — J069 Acute upper respiratory infection, unspecified: Secondary | ICD-10-CM | POA: Diagnosis not present

## 2022-10-11 DIAGNOSIS — R509 Fever, unspecified: Secondary | ICD-10-CM | POA: Diagnosis not present

## 2022-10-11 MED ORDER — AMOXICILLIN-POT CLAVULANATE 875-125 MG PO TABS: 1.0000 | ORAL_TABLET | Freq: Two times a day (BID) | ORAL | 0 refills | Status: DC

## 2022-10-11 MED ORDER — DM-GUAIFENESIN ER 30-600 MG PO TB12: 1.0000 | ORAL_TABLET | Freq: Two times a day (BID) | ORAL | 0 refills | Status: DC

## 2022-10-11 MED ORDER — ACETAMINOPHEN 500 MG PO TABS: 500.0000 mg | ORAL_TABLET | Freq: Four times a day (QID) | ORAL | 0 refills | Status: DC | PRN

## 2022-10-11 NOTE — Progress Notes (Signed)
Acute Office Visit  Subjective:     Patient ID: Philip Hughes, male    DOB: [birth year], 36 y.o.   MRN: [id number]  Chief Complaint  Patient presents with   Generalized Body Aches   Fever    Fever  This is a new problem. The current episode started yesterday. The problem occurs intermittently. The problem has been unchanged. The maximum temperature noted was 100 to 100.9 F. Associated symptoms include congestion, coughing, ear pain, headaches and a sore throat. Pertinent negatives include no abdominal pain, chest pain or rash. He has tried acetaminophen for the symptoms. The treatment provided mild relief.  URI  This is a new problem. The current episode started yesterday. The problem has been gradually worsening. The maximum temperature recorded prior to his arrival was 100.4 - 100.9 F. Associated symptoms include congestion, coughing, ear pain, headaches, a sore throat and swollen glands. Pertinent negatives include no abdominal pain, chest pain or rash. He has tried acetaminophen for the symptoms. The treatment provided no relief.  Exposure to STD The patient's pertinent negatives include no pelvic pain, penile discharge or penile pain. This is a new problem. The current episode started 1 to 4 weeks ago. The problem has been unchanged. The patient is experiencing no pain. Associated symptoms include chills, coughing, a fever, headaches and a sore throat. Pertinent negatives include no abdominal pain, chest pain or rash. Nothing aggravates the symptoms. He has tried nothing for the symptoms. He is sexually active. He inconsistently uses condoms. It is unknown whether or not his partner has an STD.    Review of Systems  Constitutional:  Positive for chills, fever and malaise/fatigue.  HENT:  Positive for congestion, ear pain and sore throat. Negative for ear discharge.   Eyes: Negative.   Respiratory:  Positive for cough.   Cardiovascular:  Negative for chest pain.  Gastrointestinal:   Negative for abdominal pain.  Genitourinary:  Negative for pelvic pain, penile discharge and penile pain.  Skin:  Negative for itching and rash.  Neurological:  Positive for headaches.  All other systems reviewed and are negative.       Objective:    BP 118/74   Pulse 90   Temp 99.9 F (37.7 C)   Ht 5\' 8"  (1.727 m)   Wt 218 lb (98.9 kg)   SpO2 98%   BMI 33.15 kg/m  BP Readings from Last 3 Encounters:  10/11/22 118/74  09/05/22 122/71  08/17/22 109/70   Wt Readings from Last 3 Encounters:  10/11/22 218 lb (98.9 kg)  09/05/22 224 lb (101.6 kg)  08/17/22 214 lb (97.1 kg)      Physical Exam Vitals and nursing note reviewed.  Constitutional:      Appearance: Normal appearance. He is obese.  HENT:     Head: Normocephalic.     Right Ear: External ear normal.     Left Ear: External ear normal.     Nose: Congestion present.     Mouth/Throat:     Mouth: Mucous membranes are moist.     Pharynx: Oropharynx is clear.  Cardiovascular:     Rate and Rhythm: Normal rate and regular rhythm.     Pulses: Normal pulses.     Heart sounds: Normal heart sounds.  Pulmonary:     Effort: Pulmonary effort is normal.     Breath sounds: Normal breath sounds.  Abdominal:     General: Bowel sounds are normal.  Neurological:     General: No focal  deficit present.     Mental Status: He is alert and oriented to person, place, and time. Mental status is at baseline.  Psychiatric:        Mood and Affect: Mood normal.        Behavior: Behavior normal.     No results found for any visits on 10/11/22.      Assessment & Plan:  Patient presents with upper respiratory infection symptoms like chills, fever, body ache, congestion, cough and fever.  Educated patient to Take meds as prescribed - Use a cool mist humidifier  -Use saline nose sprays frequently -Force fluids -For fever or aches or pains- take Tylenol or ibuprofen. -Completed COVID-19, flu, RSV results pending. Follow up with  worsening unresolved symptoms    Patient reports been exposed to STD at a concert during unprotected sex and is concerned about HIV.  Completed STD testing results pending.  Provided education on safe sex, follow-up with worsening unresolved symptoms.   Problem List Items Addressed This Visit   None Visit Diagnoses     Fever and chills    -  Primary   Relevant Medications   acetaminophen (TYLENOL) 500 MG tablet   Other Relevant Orders   COVID-19, Flu A+B and RSV   Sexually transmitted disease exposure       Relevant Orders   Ct, Ng, Mycoplasmas NAA, Urine   HIV Antibody (routine testing w rflx)   Upper respiratory tract infection, unspecified type       Relevant Medications   amoxicillin-clavulanate (AUGMENTIN) 875-125 MG tablet   dextromethorphan-guaiFENesin (MUCINEX DM) 30-600 MG 12hr tablet       Meds ordered this encounter  Medications   amoxicillin-clavulanate (AUGMENTIN) 875-125 MG tablet    Sig: Take 1 tablet by mouth 2 (two) times daily.    Dispense:  14 tablet    Refill:  0    Order Specific Question:   Supervising Provider    Answer:   Standley Brooking   dextromethorphan-guaiFENesin (MUCINEX DM) 30-600 MG 12hr tablet    Sig: Take 1 tablet by mouth 2 (two) times daily.    Dispense:  30 tablet    Refill:  0    Order Specific Question:   Supervising Provider    Answer:   Standley Brooking   acetaminophen (TYLENOL) 500 MG tablet    Sig: Take 1 tablet (500 mg total) by mouth every 6 (six) hours as needed.    Dispense:  30 tablet    Refill:  0    Order Specific Question:   Supervising Provider    Answer:   Mechele Claude [phone]    Return if symptoms worsen or fail to improve.  Daryll Drown, NP

## 2022-10-11 NOTE — Patient Instructions (Signed)
Safe Sex Practicing safe sex means taking steps before and during sex to reduce your risk of: Getting an STI (sexually transmitted infection). Giving your partner an STI. Unwanted or unplanned pregnancy. How to practice safe sex Ways you can practice safe sex  Limit your sexual partners to only one partner who is having sex with only you. Avoid using alcohol and drugs before having sex. Alcohol and drugs can affect your judgment. Before having sex with a new partner: Talk to your partner about past partners, past STIs, and drug use. Get screened for STIs and discuss the results with your partner. Ask your partner to get screened too. Check your body regularly for sores, blisters, rashes, or unusual discharge. If you notice any of these problems, visit your health care provider. Avoid sexual contact if you have symptoms of an infection or you are being treated for an STI. While having sex, use a condom. Make sure to: Use a condom every time you have vaginal, oral, or anal sex. Both females and males should wear condoms during oral sex. Keep condoms in place from the beginning to the end of sexual activity. Use a latex condom, if possible. Latex condoms offer the best protection. Use only water-based lubricants with a condom. Using petroleum-based lubricants or oils will weaken the condom and increase the chance that it will break. Ways your health care provider can help you practice safe sex  See your health care provider for regular screenings, exams, and tests for STIs. Talk with your health care provider about what kind of birth control (contraception) is best for you. Get vaccinated against hepatitis B and human papillomavirus (HPV). If you are at risk of being infected with HIV (human immunodeficiency virus), talk with your health care provider about taking a prescription medicine to prevent HIV infection. You are at risk for HIV if you: Are a man who has sex with other men. Are  sexually active with more than one partner. Take drugs by injection. Have a sex partner who has HIV. Have unprotected sex. Have sex with someone who has sex with both men and women. Have had an STI. Follow these instructions at home: Take over-the-counter and prescription medicines only as told by your health care provider. Keep all follow-up visits. This is important. Where to find more information Centers for Disease Control and Prevention: FootballExhibition.com.br Planned Parenthood: www.plannedparenthood.org Office on [employer] [facility]: http://hoffman.com/ Summary Practicing safe sex means taking steps before and during sex to reduce your risk getting an STI, giving your partner an STI, and having an unwanted or unplanned pregnancy. Before having sex with a new partner, talk to your partner about past partners, past STIs, and drug use. Use a condom every time you have vaginal, oral, or anal sex. Both females and males should wear condoms during oral sex. Check your body regularly for sores, blisters, rashes, or unusual discharge. If you notice any of these problems, visit your health care provider. See your health care provider for regular screenings, exams, and tests for STIs. This information is not intended to replace advice given to you by your health care provider. Make sure you discuss any questions you have with your health care provider. Document Revised: 05/17/2020 Document Reviewed: 05/17/2020 Elsevier Patient Education  2023 Elsevier Inc. Upper Respiratory Infection, Adult An upper respiratory infection (URI) is a common viral infection of the nose, throat, and upper air passages that lead to the lungs. The most common type of URI is the common cold. URIs usually  get better on their own, without medical treatment. What are the causes? A URI is caused by a virus. You may catch a virus by: Breathing in droplets from an infected person's cough or sneeze. Touching something that has been  exposed to the virus (is contaminated) and then touching your mouth, nose, or eyes. What increases the risk? You are more likely to get a URI if: You are very young or very old. You have close contact with others, such as at work, school, or a health care facility. You smoke. You have long-term (chronic) heart or lung disease. You have a weakened disease-fighting system (immune system). You have nasal allergies or asthma. You are experiencing a lot of stress. You have poor nutrition. What are the signs or symptoms? A URI usually involves some of the following symptoms: Runny or stuffy (congested) nose. Cough. Sneezing. Sore throat. Headache. Fatigue. Fever. Loss of appetite. Pain in your forehead, behind your eyes, and over your cheekbones (sinus pain). Muscle aches. Redness or irritation of the eyes. Pressure in the ears or face. How is this diagnosed? This condition may be diagnosed based on your medical history and symptoms, and a physical exam. Your health care provider may use a swab to take a mucus sample from your nose (nasal swab). This sample can be tested to determine what virus is causing the illness. How is this treated? URIs usually get better on their own within 7-10 days. Medicines cannot cure URIs, but your health care provider may recommend certain medicines to help relieve symptoms, such as: Over-the-counter cold medicines. Cough suppressants. Coughing is a type of defense against infection that helps to clear the respiratory system, so take these medicines only as recommended by your health care provider. Fever-reducing medicines. Follow these instructions at home: Activity Rest as needed. If you have a fever, stay home from work or school until your fever is gone or until your health care provider says your URI cannot spread to other people (is no longer contagious). Your health care provider may have you wear a face mask to prevent your infection from  spreading. Relieving symptoms Gargle with a mixture of salt and water 3-4 times a day or as needed. To make salt water, completely dissolve -1 tsp (3-6 g) of salt in 1 cup (237 mL) of warm water. Use a cool-mist humidifier to add moisture to the air. This can help you breathe more easily. Eating and drinking  Drink enough fluid to keep your urine pale yellow. Eat soups and other clear broths. General instructions  Take over-the-counter and prescription medicines only as told by your health care provider. These include cold medicines, fever reducers, and cough suppressants. Do not use any products that contain nicotine or tobacco. These products include cigarettes, chewing tobacco, and vaping devices, such as e-cigarettes. If you need help quitting, ask your health care provider. Stay away from secondhand smoke. Stay up to date on all immunizations, including the yearly (annual) flu vaccine. Keep all follow-up visits. This is important. How to prevent the spread of infection to others URIs can be contagious. To prevent the infection from spreading: Wash your hands with soap and water for at least 20 seconds. If soap and water are not available, use hand sanitizer. Avoid touching your mouth, face, eyes, or nose. Cough or sneeze into a tissue or your sleeve or elbow instead of into your hand or into the air.  Contact a health care provider if: You are getting worse instead of better.  You have a fever or chills. Your mucus is brown or red. You have yellow or brown discharge coming from your nose. You have pain in your face, especially when you bend forward. You have swollen neck glands. You have pain while swallowing. You have white areas in the back of your throat. Get help right away if: You have shortness of breath that gets worse. You have severe or persistent: Headache. Ear pain. Sinus pain. Chest pain. You have chronic lung disease along with any of the following: Making  high-pitched whistling sounds when you breathe, most often when you breathe out (wheezing). Prolonged cough (more than 14 days). Coughing up blood. A change in your usual mucus. You have a stiff neck. You have changes in your: Vision. Hearing. Thinking. Mood. These symptoms may be an emergency. Get help right away. Call 911. Do not wait to see if the symptoms will go away. Do not drive yourself to the hospital. Summary An upper respiratory infection (URI) is a common infection of the nose, throat, and upper air passages that lead to the lungs. A URI is caused by a virus. URIs usually get better on their own within 7-10 days. Medicines cannot cure URIs, but your health care provider may recommend certain medicines to help relieve symptoms. This information is not intended to replace advice given to you by your health care provider. Make sure you discuss any questions you have with your health care provider. Document Revised: 07/13/2021 Document Reviewed: 07/13/2021 Elsevier Patient Education  2023 [employer].

## 2022-10-12 LAB — HIV ANTIBODY (ROUTINE TESTING W REFLEX): HIV Screen 4th Generation wRfx: NONREACTIVE

## 2022-10-13 ENCOUNTER — Telehealth: Payer: Self-pay | Admitting: Family Medicine

## 2022-10-13 LAB — COVID-19, FLU A+B AND RSV
Influenza A, NAA: NOT DETECTED
Influenza B, NAA: NOT DETECTED
RSV, NAA: NOT DETECTED
SARS-CoV-2, NAA: DETECTED — AB

## 2022-10-13 NOTE — Telephone Encounter (Signed)
Pt aware all labs are not back yet and once they are reviewed by the provider we will call him.

## 2022-10-15 LAB — CT, NG, MYCOPLASMAS NAA, URINE
Chlamydia trachomatis, NAA: NEGATIVE
Mycoplasma genitalium NAA: NEGATIVE
Mycoplasma hominis NAA: NEGATIVE
Neisseria gonorrhoeae, NAA: NEGATIVE
Ureaplasma spp NAA: POSITIVE — AB

## 2022-10-16 ENCOUNTER — Telehealth: Payer: Self-pay | Admitting: Family Medicine

## 2022-10-17 NOTE — Telephone Encounter (Signed)
Je,  Please review and we will call pt. Thanks!

## 2022-11-27 ENCOUNTER — Telehealth: Payer: Self-pay | Admitting: Family Medicine

## 2022-11-27 DIAGNOSIS — R1013 Epigastric pain: Secondary | ICD-10-CM

## 2022-11-27 MED ORDER — PANTOPRAZOLE SODIUM 40 MG PO TBEC: 40.0000 mg | DELAYED_RELEASE_TABLET | Freq: Every day | ORAL | 0 refills | Status: DC

## 2022-11-27 NOTE — Telephone Encounter (Signed)
  Prescription Request  11/27/2022  Is this a "Controlled Substance" medicine? no  Have you seen your PCP in the last 2 weeks? Appt 12/13  If YES, route message to pool  -  If NO, patient needs to be scheduled for appointment.  What is the name of the medication or equipment? pantoprazole (PROTONIX) 40 MG tablet   Have you contacted your pharmacy to request a refill? Switched pharm   Which pharmacy would you like this sent to? [facility] in [place]   Patient notified that their request is being sent to the clinical staff for review and that they should receive a response within 2 business days.

## 2022-11-27 NOTE — Telephone Encounter (Signed)
Pt aware refill sent to [facility] °

## 2022-12-06 ENCOUNTER — Ambulatory Visit: Payer: BC Managed Care – PPO | Admitting: Family Medicine

## 2022-12-14 ENCOUNTER — Ambulatory Visit: Payer: BC Managed Care – PPO | Admitting: Family Medicine

## 2022-12-27 ENCOUNTER — Ambulatory Visit: Payer: BC Managed Care – PPO | Admitting: Family Medicine

## 2022-12-29 ENCOUNTER — Ambulatory Visit: Payer: BC Managed Care – PPO | Admitting: Family Medicine

## 2022-12-29 ENCOUNTER — Other Ambulatory Visit (HOSPITAL_COMMUNITY)
Admission: RE | Admit: 2022-12-29 | Discharge: 2022-12-29 | Disposition: A | Discharge status: Home / Self Care | Payer: BC Managed Care – PPO | Source: Ambulatory Visit | Attending: Family Medicine | Admitting: Family Medicine

## 2022-12-29 ENCOUNTER — Encounter: Payer: Self-pay | Admitting: Family Medicine

## 2022-12-29 VITALS — BP 121/74 | HR 81 | Temp 97.6°F | Ht 68.0 in | Wt 226.4 lb

## 2022-12-29 DIAGNOSIS — Z9189 Other specified personal risk factors, not elsewhere classified: Secondary | ICD-10-CM

## 2022-12-29 DIAGNOSIS — Z7251 High risk heterosexual behavior: Secondary | ICD-10-CM

## 2022-12-29 MED ORDER — EMTRICITABINE-TENOFOVIR DF 200-300 MG PO TABS: 1.0000 | ORAL_TABLET | Freq: Every day | ORAL | 0 refills | Status: DC

## 2022-12-29 MED ORDER — RALTEGRAVIR POTASSIUM 400 MG PO TABS: 400.0000 mg | ORAL_TABLET | Freq: Two times a day (BID) | ORAL | 0 refills | Status: DC

## 2022-12-29 MED ORDER — RALTEGRAVIR POTASSIUM 400 MG PO TABS: 400.0000 mg | ORAL_TABLET | Freq: Two times a day (BID) | ORAL | 0 refills | Status: AC

## 2022-12-29 MED ORDER — EMTRICITABINE-TENOFOVIR DF 200-300 MG PO TABS: 1.0000 | ORAL_TABLET | Freq: Every day | ORAL | 0 refills | Status: AC

## 2022-12-29 NOTE — Progress Notes (Signed)
Subjective:  Patient ID: Philip Hughes, male    DOB: [birth year], 37 y.o.   MRN: [id number]  Patient Care Team: Baruch Gouty, FNP as PCP - General (Family Medicine)   Chief Complaint:  std check  (Patient states he had a condom break on Wednesday and now would like to be STD checked. )   HPI: Philip Hughes is a 37 y.o. male presenting on 12/29/2022 for std check  (Patient states he had a condom break on Wednesday and now would like to be STD checked. )   Pt presents today for STI evaluation. States on Wednesday night he was sexually active with 4 women at one time. States condom broke during this encounter. He is very concerned about HIV / Hep C exposure and would like to be tested for all STIs and to start PEP therapy. Denies any symptoms.     Relevant past medical, surgical, family, and social history reviewed and updated as indicated.  Allergies and medications reviewed and updated. Data reviewed: Chart in Epic.   Past Medical History:  Diagnosis Date   History of meningitis    age 12   Migraines     Past Surgical History:  Procedure Laterality Date   KNEE ARTHROSCOPY Left 06/11/2015   Procedure: LEFT KNEE ARTHROSCOPY WITH LOOSE BODY REMOVAL;  Surgeon: Carole Civil, MD;  Location: [facility];  Service: Orthopedics;  Laterality: Left;   TONSILLECTOMY      Social History   Socioeconomic History   Marital status: Single    Spouse name: Not on file   Number of children: Not on file   Years of education: Not on file   Highest education level: Not on file  Occupational History   Not on file  Tobacco Use   Smoking status: Former    Packs/day: 1.00    Years: 6.00    Total pack years: 6.00    Types: Cigarettes    Quit date: 12/19/2016    Years since quitting: 6.0   Smokeless tobacco: Never  Substance and Sexual Activity   Alcohol use: Yes    Alcohol/week: 5.0 - 6.0 standard drinks of alcohol    Types: 5 - 6 Cans of beer per week   Drug use: No   Sexual  activity: Yes    Birth control/protection: None  Other Topics Concern   Not on file  Social History Narrative   Not on file   Social Determinants of Health   Financial Resource Strain: Not on file  Food Insecurity: Not on file  Transportation Needs: Not on file  Physical Activity: Not on file  Stress: Not on file  Social Connections: Not on file  Intimate Partner Violence: Not on file    Outpatient Encounter Medications as of 12/29/2022  Medication Sig   acetaminophen (TYLENOL) 500 MG tablet Take 1 tablet (500 mg total) by mouth every 6 (six) hours as needed.   emtricitabine-tenofovir (TRUVADA) 200-300 MG tablet Take 1 tablet by mouth daily for 28 days.   pantoprazole (PROTONIX) 40 MG tablet Take 1 tablet (40 mg total) by mouth daily.   raltegravir (ISENTRESS) 400 MG tablet Take 1 tablet (400 mg total) by mouth 2 (two) times daily for 28 days.   [DISCONTINUED] amoxicillin-clavulanate (AUGMENTIN) 875-125 MG tablet Take 1 tablet by mouth 2 (two) times daily.   [DISCONTINUED] dextromethorphan-guaiFENesin (MUCINEX DM) 30-600 MG 12hr tablet Take 1 tablet by mouth 2 (two) times daily.   No facility-administered encounter medications  on file as of 12/29/2022.    No Known Allergies  Review of Systems  Constitutional:  Negative for activity change, appetite change, chills, fatigue and fever.  HENT: Negative.    Eyes: Negative.   Respiratory:  Negative for cough, chest tightness and shortness of breath.   Cardiovascular:  Negative for chest pain, palpitations and leg swelling.  Gastrointestinal:  Negative for abdominal pain, blood in stool, constipation, diarrhea, nausea and vomiting.  Endocrine: Negative.   Genitourinary:  Negative for decreased urine volume, difficulty urinating, dysuria, enuresis, flank pain, frequency, genital sores, hematuria, penile discharge, penile pain, penile swelling, scrotal swelling, testicular pain and urgency.  Musculoskeletal:  Negative for arthralgias,  joint swelling and myalgias.  Skin: Negative.   Allergic/Immunologic: Negative.   Neurological:  Negative for dizziness, weakness and headaches.  Hematological: Negative.   Psychiatric/Behavioral:  Negative for confusion, hallucinations, sleep disturbance and suicidal ideas.   All other systems reviewed and are negative.       Objective:  BP 121/74   Pulse 81   Temp 97.6 F (36.4 C) (Temporal)   Ht 5\' 8"  (1.727 m)   Wt 226 lb 6.4 oz (102.7 kg)   SpO2 95%   BMI 34.42 kg/m    Wt Readings from Last 3 Encounters:  12/29/22 226 lb 6.4 oz (102.7 kg)  10/11/22 218 lb (98.9 kg)  09/05/22 224 lb (101.6 kg)    Physical Exam Vitals and nursing note reviewed.  Constitutional:      General: He is not in acute distress.    Appearance: Normal appearance. He is obese. He is not ill-appearing, toxic-appearing or diaphoretic.  HENT:     Head: Normocephalic and atraumatic.     Nose: Nose normal.     Mouth/Throat:     Mouth: Mucous membranes are moist.  Eyes:     Conjunctiva/sclera: Conjunctivae normal.     Pupils: Pupils are equal, round, and reactive to light.  Cardiovascular:     Rate and Rhythm: Normal rate and regular rhythm.  Pulmonary:     Effort: Pulmonary effort is normal.     Breath sounds: Normal breath sounds.  Musculoskeletal:        General: No swelling.  Skin:    General: Skin is warm and dry.     Capillary Refill: Capillary refill takes less than 2 seconds.  Neurological:     General: No focal deficit present.     Mental Status: He is alert and oriented to person, place, and time.  Psychiatric:        Mood and Affect: Mood normal.        Behavior: Behavior normal.        Thought Content: Thought content normal.        Judgment: Judgment normal.     Results for orders placed or performed in visit on 10/11/22  COVID-19, Flu A+B and RSV   Specimen: Nasopharyngeal(NP) swabs in vial transport medium  Result Value Ref Range   SARS-CoV-2, NAA Detected (A) Not  Detected   Influenza A, NAA Not Detected Not Detected   Influenza B, NAA Not Detected Not Detected   RSV, NAA Not Detected Not Detected   Test Information: Comment   Ct, Ng, Mycoplasmas NAA, Urine  Result Value Ref Range   Mycoplasma genitalium NAA Negative Negative   Mycoplasma hominis NAA Negative Negative   Ureaplasma spp NAA Positive (A) Negative   Chlamydia trachomatis, NAA Negative Negative   Neisseria gonorrhoeae, NAA Negative Negative  HIV Antibody (  routine testing w rflx)  Result Value Ref Range   HIV Screen 4th Generation wRfx Non Reactive Non Reactive       Pertinent labs & imaging results that were available during my care of the patient were reviewed by me and considered in my medical decision making.  Assessment & Plan:  Deldrick was seen today for std check .  Diagnoses and all orders for this visit:  High risk heterosexual behavior At risk for sexually transmitted disease due to unprotected sex PEP therapy initiated. Labs pending. Safe sex practices discussed in detail. Further treatment pending results.  -     emtricitabine-tenofovir (TRUVADA) 200-300 MG tablet; Take 1 tablet by mouth daily for 28 days. -     raltegravir (ISENTRESS) 400 MG tablet; Take 1 tablet (400 mg total) by mouth 2 (two) times daily for 28 days. -     HepB+HepC+HIV Panel -     [facility] -     Urine cytology ancillary only     Continue all other maintenance medications.  Follow up plan: Return if symptoms worsen or fail to improve.   Continue healthy lifestyle choices, including diet (rich in fruits, vegetables, and lean proteins, and low in salt and simple carbohydrates) and exercise (at least 30 minutes of moderate physical activity daily).  Educational handout given for safe sex  The above assessment and management plan was discussed with the patient. The patient verbalized understanding of and has agreed to the management plan. Patient is aware to call the clinic if they develop any new  symptoms or if symptoms persist or worsen. Patient is aware when to return to the clinic for a follow-up visit. Patient educated on when it is appropriate to go to the emergency department.   Monia Pouch, FNP-C [facility] [phone]

## 2022-12-29 NOTE — Addendum Note (Signed)
Addended by: Baruch Gouty on: 12/29/2022 03:47 PM   Modules accepted: Orders

## 2023-01-02 LAB — HEPB+HEPC+HIV PANEL
HIV Screen 4th Generation wRfx: NONREACTIVE
Hep B C IgM: NEGATIVE
Hep B Core Total Ab: NEGATIVE
Hep B E Ab: NEGATIVE
Hep B E Ag: NEGATIVE
Hep B Surface Ab, Qual: NONREACTIVE
Hep C Virus Ab: NONREACTIVE
Hepatitis B Surface Ag: NEGATIVE

## 2023-01-02 LAB — URINE CYTOLOGY ANCILLARY ONLY
Bacterial Vaginitis-Urine: NEGATIVE
Candida Urine: NEGATIVE
Chlamydia: NEGATIVE
Comment: NEGATIVE
Comment: NEGATIVE
Comment: NORMAL
Neisseria Gonorrhea: NEGATIVE
Trichomonas: NEGATIVE

## 2023-01-02 LAB — SYPHILIS: RPR W/REFLEX TO RPR TITER AND TREPONEMAL ANTIBODIES, TRADITIONAL SCREENING AND DIAGNOSIS ALGORITHM: RPR Ser Ql: NONREACTIVE

## 2023-01-15 ENCOUNTER — Other Ambulatory Visit (HOSPITAL_COMMUNITY): Payer: Self-pay

## 2023-01-18 ENCOUNTER — Encounter: Payer: Self-pay | Admitting: Nurse Practitioner

## 2023-01-18 ENCOUNTER — Telehealth (INDEPENDENT_AMBULATORY_CARE_PROVIDER_SITE_OTHER): Payer: BC Managed Care – PPO | Admitting: Nurse Practitioner

## 2023-01-18 DIAGNOSIS — S9031XA Contusion of right foot, initial encounter: Secondary | ICD-10-CM | POA: Diagnosis not present

## 2023-01-18 DIAGNOSIS — S60129A Contusion of unspecified index finger with damage to nail, initial encounter: Secondary | ICD-10-CM

## 2023-01-18 NOTE — Patient Instructions (Signed)
Nail Bed Injury The nail bed is the soft tissue under a fingernail or toenail. The nail bed can be injured in different ways. You may: Get bruising or bleeding under the nail. Get cuts in the nail or nail bed. Lose all or part of the nail. After your nail is hurt or torn off, it can take many months to grow again. The nail may not grow back normally. What are the causes? This condition is usually caused by crushing, pinching, cutting, or tearing injuries of the fingertip or toe. These injuries might happen when a finger or toe gets: Caught in a door. Hit by a hammer. Damaged in accidents while you are using power tools or machinery. What are the signs or symptoms? Symptoms vary depending on the type of injury. Symptoms may include: Pain in the injured area. Bleeding. Swelling. A change in color of the area. Collection of blood under the nail (hematoma). Damage to the nail, such as: A deformed or split nail. A loose nail that is not stuck to the nail bed. Loss of all or part of the nail. How is this treated? Treatment may depend on the type of injury. Some injuries may not need any treatment other than keeping the area clean and free of infection. Treatment may include: Draining blood from under the nail. This can be done by making a small hole in the nail. Removing all or part of your nail. This might be done in order to stitch (suture) any cut in the nail bed. Stitching a torn-off nail back in place. Putting bandages (dressings) or splints on the area. Taking medicines, such as: Antibiotic medicine to help prevent infection. Pain medicine. Having a tetanus shot. This may be needed if you have not had a tetanus shot in the last 10 years. Follow these instructions at home: Managing pain, stiffness, and swelling Raise (elevate) the injured area above the level of your heart while you are sitting or lying down. Keep your injury protected with bandages or splints as told by your  doctor. For an injured toenail: Try not to walk on your injured leg. Wear an open-toed shoe when you walk. Try not to let your leg hang down (dangle) when you are sitting or lying down. Wound care Follow any wound care instructions given by your doctor. Make sure you: Keep the injured area clean. Keep any bandages clean and dry. Wash your hands with soap and water for at least 20 seconds before and after you change any bandage. If you cannot use soap and water, use hand sanitizer. Change or take off the bandage only as told by your doctor. Leave any stitches in place. These may need to stay in place for 2 weeks. Check the injured area every day for signs of infection. Check for: More redness, swelling, or pain. More fluid or blood. Warmth. Pus or a bad smell. General instructions Take over-the-counter and prescription medicines only as told by your doctor. If you were prescribed an antibiotic medicine, use it as told by your doctor. Do not stop using the antibiotic even if you start to feel better. Ask your doctor if the medicine prescribed to you requires you to avoid driving or using machinery. Keep all follow-up visits as told by your doctor. This is important. Contact a doctor if: Medicine does not help your pain. You have any of these problems in the injured area: More redness. More pain or swelling. More fluid or blood coming from the area. The area feels  warm to the touch. Pus or a bad smell coming from the area. You have a fever and your symptoms get worse. Get help right away if: You lose feeling (have numbness) in your finger or toe. Your finger or toe turns blue. Summary The nail bed is the soft tissue under a fingernail or toenail. Sometimes, after a nail or nail bed is injured, the nail may not grow back normally. Raise (elevate) the injured area above the level of your heart while you are sitting or lying down. Keep any bandages clean and dry. Change or take them  off only as told by your doctor. Take over-the-counter and prescription medicines only as told by your doctor. This information is not intended to replace advice given to you by your health care provider. Make sure you discuss any questions you have with your health care provider. Document Revised: 09/26/2019 Document Reviewed: 09/26/2019 Elsevier Patient Education  [place].

## 2023-01-18 NOTE — Progress Notes (Signed)
Virtual Visit Consent   Philip Hughes, you are scheduled for a virtual visit with Mary-Margaret Hassell Done, [place], a [facility] provider, today.     Just as with appointments in the office, your consent must be obtained to participate.  Your consent will be active for this visit and any virtual visit you may have with one of our providers in the next 365 days.     If you have a MyChart account, a copy of this consent can be sent to you electronically.  All virtual visits are billed to your insurance company just like a traditional visit in the office.    As this is a virtual visit, video technology does not allow for your provider to perform a traditional examination.  This may limit your provider's ability to fully assess your condition.  If your provider identifies any concerns that need to be evaluated in person or the need to arrange testing (such as labs, EKG, etc.), we will make arrangements to do so.     Although advances in technology are sophisticated, we cannot ensure that it will always work on either your end or our end.  If the connection with a video visit is poor, the visit may have to be switched to a telephone visit.  With either a video or telephone visit, we are not always able to ensure that we have a secure connection.     I need to obtain your verbal consent now.   Are you willing to proceed with your visit today? YES   Philip Hughes has provided verbal consent on 01/18/2023 for a virtual visit (video or telephone).   Mary-Margaret Hassell Done, FNP   Date: 01/18/2023 11:42 AM   Virtual Visit via Video Note   I, Mary-Margaret Hassell Done, connected with Philip Hughes ([id number], [birth year]) on 01/18/23 at 12:15 PM EST by a video-enabled telemedicine application and verified that I am speaking with the correct person using two identifiers.  Location: Patient: Virtual Visit Location Patient: Home Provider: Virtual Visit Location Provider: Mobile   I discussed the limitations  of evaluation and management by telemedicine and the availability of in person appointments. The patient expressed understanding and agreed to proceed.    History of Present Illness: Philip Hughes is a 37 y.o. who identifies as a male who was assigned male at birth, and is being seen today for foot injury.  HPI: Patient dropped a piece of steel on his foot yesterday. Hit his right big toe . He cut his toe nail down  to relieve pressure underneath it.     Review of Systems  Constitutional:  Negative for diaphoresis and weight loss.  Eyes:  Negative for blurred vision, double vision and pain.  Respiratory:  Negative for shortness of breath.   Cardiovascular:  Negative for chest pain, palpitations, orthopnea and leg swelling.  Gastrointestinal:  Negative for abdominal pain.  Skin:  Negative for rash.  Neurological:  Negative for dizziness, sensory change, loss of consciousness, weakness and headaches.  Endo/Heme/Allergies:  Negative for polydipsia. Does not bruise/bleed easily.  Psychiatric/Behavioral:  Negative for memory loss. The patient does not have insomnia.   All other systems reviewed and are negative.   Problems:  Patient Active Problem List   Diagnosis Date Noted   Gastroesophageal reflux disease without esophagitis 09/05/2022    Allergies: No Known Allergies Medications:  Current Outpatient Medications:    acetaminophen (TYLENOL) 500 MG tablet, Take 1 tablet (500 mg total) by mouth every 6 (  six) hours as needed., Disp: 30 tablet, Rfl: 0   emtricitabine-tenofovir (TRUVADA) 200-300 MG tablet, Take 1 tablet by mouth daily for 28 days., Disp: 28 tablet, Rfl: 0   pantoprazole (PROTONIX) 40 MG tablet, Take 1 tablet (40 mg total) by mouth daily., Disp: 90 tablet, Rfl: 0   raltegravir (ISENTRESS) 400 MG tablet, Take 1 tablet (400 mg total) by mouth 2 (two) times daily for 28 days., Disp: 56 tablet, Rfl: 0  Observations/Objective: Patient is well-developed, well-nourished in no  acute distress.  Resting comfortably  at home.  Head is normocephalic, atraumatic.  No labored breathing.  Speech is clear and coherent with logical content.  Patient is alert and oriented at baseline.  Right toe nail contusion- no erythema and no drainage  Assessment and Plan:  Philip Hughes in today with chief complaint of Foot Injury   1. Contusion of index finger with damage to nail, initial encounter Warm epsom salt soaks Watch for signs of infection RTO prn   Follow Up Instructions: I discussed the assessment and treatment plan with the patient. The patient was provided an opportunity to ask questions and all were answered. The patient agreed with the plan and demonstrated an understanding of the instructions.  A copy of instructions were sent to the patient via MyChart.  The patient was advised to call back or seek an in-person evaluation if the symptoms worsen or if the condition fails to improve as anticipated.  Time:  I spent 8 minutes with the patient via telehealth technology discussing the above problems/concerns.    Mary-Margaret Hassell Done, FNP

## 2023-01-31 ENCOUNTER — Ambulatory Visit: Payer: BC Managed Care – PPO | Admitting: Family Medicine

## 2023-02-19 ENCOUNTER — Other Ambulatory Visit: Payer: Self-pay | Admitting: Family Medicine

## 2023-02-19 DIAGNOSIS — R1013 Epigastric pain: Secondary | ICD-10-CM

## 2023-02-27 ENCOUNTER — Ambulatory Visit: Payer: BC Managed Care – PPO | Admitting: Family Medicine

## 2023-02-28 ENCOUNTER — Encounter: Payer: Self-pay | Admitting: Family Medicine

## 2023-03-19 ENCOUNTER — Encounter: Payer: Self-pay | Admitting: Family Medicine

## 2023-03-19 ENCOUNTER — Ambulatory Visit: Payer: BC Managed Care – PPO | Admitting: Family Medicine

## 2023-03-19 VITALS — BP 130/81 | HR 100 | Temp 98.3°F | Ht 68.0 in | Wt 220.0 lb

## 2023-03-19 DIAGNOSIS — J029 Acute pharyngitis, unspecified: Secondary | ICD-10-CM

## 2023-03-19 DIAGNOSIS — R509 Fever, unspecified: Secondary | ICD-10-CM

## 2023-03-19 DIAGNOSIS — R0981 Nasal congestion: Secondary | ICD-10-CM

## 2023-03-19 LAB — VERITOR FLU A/B WAIVED
Influenza A: NEGATIVE
Influenza B: NEGATIVE

## 2023-03-19 LAB — RAPID STREP SCREEN (MED CTR MEBANE ONLY): Strep Gp A Ag, IA W/Reflex: NEGATIVE

## 2023-03-19 LAB — CULTURE, GROUP A STREP

## 2023-03-19 MED ORDER — AMOXICILLIN-POT CLAVULANATE 875-125 MG PO TABS: 1.0000 | ORAL_TABLET | Freq: Two times a day (BID) | ORAL | 0 refills | Status: DC

## 2023-03-19 MED ORDER — FLUTICASONE PROPIONATE 50 MCG/ACT NA SUSP: 2.0000 | Freq: Every day | NASAL | 6 refills | Status: DC

## 2023-03-19 NOTE — Progress Notes (Signed)
Subjective: CC: Sore throat PCP: Baruch Gouty, FNP [id number] Philip Hughes is Philip 37 y.o. male presenting to clinic today for:  1.  Flulike symptoms Patient reports hot flashes, sore throat, myalgia and congestion.  Symptoms started on Thursday evening with just Philip scratchy throat and then progressed over the weekend.  He notes that he tried multiple things over-the-counter including NyQuil, alcohol.  He had COVID-19 in October and this does not feel like that.  His mom is sick with similar but has not been evaluated nor diagnosed with anything.  He reports decreased appetite.  Throat is dry feeling despite him hydrating well with water and electrolytes.   ROS: Per HPI  No Known Allergies Past Medical History:  Diagnosis Date   History of meningitis    age 15   Migraines     Current Outpatient Medications:    acetaminophen (TYLENOL) 500 MG tablet, Take 1 tablet (500 mg total) by mouth every 6 (six) hours as needed., Disp: 30 tablet, Rfl: 0   pantoprazole (PROTONIX) 40 MG tablet, TAKE 1 TABLET BY MOUTH EVERY DAY, Disp: 90 tablet, Rfl: 0 Social History   Socioeconomic History   Marital status: Single    Spouse name: Not on file   Number of children: Not on file   Years of education: Not on file   Highest education level: Not on file  Occupational History   Not on file  Tobacco Use   Smoking status: Former    Packs/day: 1.00    Years: 6.00    Additional pack years: 0.00    Total pack years: 6.00    Types: Cigarettes    Quit date: 12/19/2016    Years since quitting: 6.2   Smokeless tobacco: Never  Substance and Sexual Activity   Alcohol use: Yes    Alcohol/week: 5.0 - 6.0 standard drinks of alcohol    Types: 5 - 6 Cans of beer per week   Drug use: No   Sexual activity: Yes    Birth control/protection: None  Other Topics Concern   Not on file  Social History Narrative   Not on file   Social Determinants of Health   Financial Resource Strain: Not on file  Food  Insecurity: Not on file  Transportation Needs: Not on file  Physical Activity: Not on file  Stress: Not on file  Social Connections: Not on file  Intimate Partner Violence: Not on file   Family History  Problem Relation Age of Onset   Diabetes Mother    Asthma Mother    Cancer Other    Hypertension Other     Objective: Office vital signs reviewed. BP 130/81   Pulse 100   Temp 98.3 F (36.8 C)   Ht 5\' 8"  (1.727 m)   Wt 220 lb (99.8 kg)   SpO2 96%   BMI 33.45 kg/m   Physical Examination:  General: Awake, alert, nontoxic male, No acute distress HEENT: Normal    Neck: No masses palpated. No lymphadenopathy    Ears: Tympanic membranes intact, normal light reflex, no erythema, no bulging    Eyes: PERRLA, extraocular membranes intact, sclera white    Nose: nasal turbinates moist, clear nasal discharge    Throat: moist mucus membranes, moderate oropharyngeal erythema, WHITE tonsillar exudate.  Grade 3 tonsils.  Airway is patent Cardio: regular rate and rhythm, S1S2 heard, no murmurs appreciated Pulm: clear to auscultation bilaterally, no wheezes, rhonchi or rales; normal work of breathing on room air  Assessment/ Plan: 37 y.o. male   Exudative pharyngitis - Plan: amoxicillin-clavulanate (AUGMENTIN) 875-125 MG tablet  Fever in adult - Plan: COVID-19, Flu Philip+B and RSV, Rapid Strep Screen ([facility] ONLY), Veritor Flu Philip/B Waived  Nasal congestion - Plan: fluticasone (FLONASE) 50 MCG/ACT nasal spray  He has negative rapid strep but based on symptomology, I am suspicious.  I have placed him on Augmentin twice daily for 10 days.  Flonase for nasal congestion.  Rapid flu negative.  Send off triple swab.    Orders Placed This Encounter  Procedures   COVID-19, Flu Philip+B and RSV    Order Specific Question:   Previously tested for COVID-19    Answer:   Yes    Order Specific Question:   Resident in Philip congregate (group) care setting    Answer:   No    Order Specific Question:    Is the patient student?    Answer:   No    Order Specific Question:   Employed in healthcare setting    Answer:   No    Order Specific Question:   Has patient completed COVID vaccination(s) (2 doses of Pfizer/Moderna 1 dose of [employer])    Answer:   No   Rapid Strep Screen ([facility] ONLY)   Veritor Flu Philip/B Waived    Order Specific Question:   Source    Answer:   nasal   No orders of the defined types were placed in this encounter.    Philip Norlander, DO [place] [phone]

## 2023-03-19 NOTE — Patient Instructions (Signed)

## 2023-03-20 LAB — COVID-19, FLU A+B AND RSV
Influenza A, NAA: NOT DETECTED
Influenza B, NAA: NOT DETECTED
RSV, NAA: NOT DETECTED
SARS-CoV-2, NAA: NOT DETECTED

## 2023-03-22 ENCOUNTER — Encounter: Payer: Self-pay | Admitting: Family Medicine

## 2023-03-22 ENCOUNTER — Ambulatory Visit: Payer: BC Managed Care – PPO | Admitting: Family Medicine

## 2023-03-22 VITALS — BP 135/87 | HR 110 | Temp 98.3°F | Ht 68.0 in | Wt 222.2 lb

## 2023-03-22 DIAGNOSIS — K219 Gastro-esophageal reflux disease without esophagitis: Secondary | ICD-10-CM | POA: Diagnosis not present

## 2023-03-22 DIAGNOSIS — Z7251 High risk heterosexual behavior: Secondary | ICD-10-CM | POA: Diagnosis not present

## 2023-03-22 DIAGNOSIS — F419 Anxiety disorder, unspecified: Secondary | ICD-10-CM | POA: Diagnosis not present

## 2023-03-22 MED ORDER — HYDROXYZINE PAMOATE 25 MG PO CAPS: 25.0000 mg | ORAL_CAPSULE | Freq: Three times a day (TID) | ORAL | 0 refills | Status: DC | PRN

## 2023-03-22 NOTE — Progress Notes (Signed)
Subjective:  Patient ID: Philip Hughes, male    DOB: 1986-09-28, 37 y.o.   MRN: [id number]  Patient Care Team: Baruch Gouty, FNP as PCP - General (Family Medicine)   Chief Complaint:  Gastroesophageal Reflux (3 month follow up )   HPI: Philip Hughes is a 37 y.o. male presenting on 03/22/2023 for Gastroesophageal Reflux (3 month follow up )    1. Gastroesophageal reflux disease without esophagitis Compliant with medications - Yes Current medications - Protonix Adverse side effects - No Cough - No Sore throat - No Voice change - No Hemoptysis - No Dysphagia or dyspepsia - No Water brash - No Red Flags (weight loss, hematochezia, melena, weight loss, early satiety, fevers, odynophagia, or persistent vomiting) - No   2. High risk heterosexual behavior Was tested after high risk sexual behavior. Denies symptoms but would like repeat testing today.   3. Anxiety Worsening over the last several week. Has not been on medications for this in the past but feels he is starting to have anxiety spells and would like to have something to take as needed to help manage symptoms.    03/22/2023    3:23 PM 03/19/2023   10:26 AM 12/29/2022    9:25 AM 09/05/2022    3:55 PM  GAD 7 : Generalized Anxiety Score  Nervous, Anxious, on Edge 1 0 0 0  Control/stop worrying 1 1 1  0  Worry too much - different things 1 1 1 1   Trouble relaxing 1 1 1 1   Restless 1 0 0 0  Easily annoyed or irritable 1 1 1  0  Afraid - awful might happen 0 0 0 0  Total GAD 7 Score 6 4 4 2   Anxiety Difficulty Somewhat difficult Somewhat difficult Somewhat difficult Somewhat difficult       03/22/2023    3:23 PM 03/19/2023   10:26 AM 12/29/2022    9:25 AM 10/11/2022   10:20 AM 09/05/2022    3:55 PM  Depression screen PHQ 2/9  Decreased Interest 0 1 0 0 0  Down, Depressed, Hopeless 0 0 0 0 0  PHQ - 2 Score 0 1 0 0 0  Altered sleeping 2 0 2 0 1  Tired, decreased energy 1 1 1  0 1  Change in appetite 1 1 0 0 0   Feeling bad or failure about yourself  0 0 0 0 0  Trouble concentrating 0 1 0 0 0  Moving slowly or fidgety/restless 0 0 0 0 0  Suicidal thoughts 0 0 0 0 0  PHQ-9 Score 4 4 3  0 2  Difficult doing work/chores Not difficult at all Somewhat difficult Not difficult at all Not difficult at all Somewhat difficult        Relevant past medical, surgical, family, and social history reviewed and updated as indicated.  Allergies and medications reviewed and updated. Data reviewed: Chart in Epic.   Past Medical History:  Diagnosis Date   History of meningitis    age 26   Migraines     Past Surgical History:  Procedure Laterality Date   KNEE ARTHROSCOPY Left 06/11/2015   Procedure: LEFT KNEE ARTHROSCOPY WITH LOOSE BODY REMOVAL;  Surgeon: Carole Civil, MD;  Location: [facility];  Service: Orthopedics;  Laterality: Left;   TONSILLECTOMY      Social History   Socioeconomic History   Marital status: Single    Spouse name: Not on file   Number of children: Not on  file   Years of education: Not on file   Highest education level: Not on file  Occupational History   Not on file  Tobacco Use   Smoking status: Every Day    Packs/day: 0.50    Years: 6.00    Additional pack years: 0.00    Total pack years: 3.00    Types: Cigarettes   Smokeless tobacco: Never  Vaping Use   Vaping Use: Never used  Substance and Sexual Activity   Alcohol use: Yes    Alcohol/week: 5.0 - 6.0 standard drinks of alcohol    Types: 5 - 6 Cans of beer per week   Drug use: No   Sexual activity: Yes    Birth control/protection: None  Other Topics Concern   Not on file  Social History Narrative   Not on file   Social Determinants of Health   Financial Resource Strain: Not on file  Food Insecurity: Not on file  Transportation Needs: Not on file  Physical Activity: Not on file  Stress: Not on file  Social Connections: Not on file  Intimate Partner Violence: Not on file    Outpatient Encounter  Medications as of 03/22/2023  Medication Sig   acetaminophen (TYLENOL) 500 MG tablet Take 1 tablet (500 mg total) by mouth every 6 (six) hours as needed.   fluticasone (FLONASE) 50 MCG/ACT nasal spray Place 2 sprays into both nostrils daily.   hydrOXYzine (VISTARIL) 25 MG capsule Take 1 capsule (25 mg total) by mouth every 8 (eight) hours as needed for anxiety. Can take one at bedtime and repeat in 1 hour if needed   pantoprazole (PROTONIX) 40 MG tablet TAKE 1 TABLET BY MOUTH EVERY DAY   [DISCONTINUED] amoxicillin-clavulanate (AUGMENTIN) 875-125 MG tablet Take 1 tablet by mouth 2 (two) times daily.   No facility-administered encounter medications on file as of 03/22/2023.    No Known Allergies  Review of Systems  Constitutional:  Positive for activity change, appetite change and fatigue. Negative for chills, diaphoresis, fever and unexpected weight change.  HENT: Negative.    Eyes: Negative.   Respiratory:  Negative for cough, chest tightness and shortness of breath.   Cardiovascular:  Negative for chest pain, palpitations and leg swelling.  Gastrointestinal:  Negative for abdominal pain, blood in stool, constipation, diarrhea, nausea and vomiting.  Endocrine: Negative.   Genitourinary:  Negative for decreased urine volume, difficulty urinating, dysuria, enuresis, flank pain, frequency, genital sores, hematuria, penile discharge, penile pain, penile swelling, scrotal swelling, testicular pain and urgency.  Musculoskeletal:  Negative for arthralgias and myalgias.  Skin: Negative.   Allergic/Immunologic: Negative.   Neurological:  Negative for dizziness, weakness and headaches.  Hematological: Negative.   Psychiatric/Behavioral:  Positive for agitation, decreased concentration and sleep disturbance. Negative for behavioral problems, confusion, dysphoric mood, hallucinations, self-injury and suicidal ideas. The patient is nervous/anxious. The patient is not hyperactive.   All other systems  reviewed and are negative.       Objective:  BP 135/87   Pulse (!) 110   Temp 98.3 F (36.8 C) (Temporal)   Ht 5\' 8"  (1.727 m)   Wt 222 lb 3.2 oz (100.8 kg)   SpO2 95%   BMI 33.79 kg/m    Wt Readings from Last 3 Encounters:  03/22/23 222 lb 3.2 oz (100.8 kg)  03/19/23 220 lb (99.8 kg)  12/29/22 226 lb 6.4 oz (102.7 kg)    Physical Exam Vitals and nursing note reviewed.  Constitutional:      General:  He is not in acute distress.    Appearance: Normal appearance. He is well-developed and well-groomed. He is obese. He is not ill-appearing, toxic-appearing or diaphoretic.  HENT:     Head: Normocephalic and atraumatic.     Jaw: There is normal jaw occlusion.     Right Ear: Hearing normal.     Left Ear: Hearing normal.     Nose: Nose normal.     Mouth/Throat:     Lips: Pink.     Mouth: Mucous membranes are moist.     Dentition: Abnormal dentition. Dental caries present.     Pharynx: Oropharynx is clear. Uvula midline.  Eyes:     General: Lids are normal.     Extraocular Movements: Extraocular movements intact.     Conjunctiva/sclera: Conjunctivae normal.     Pupils: Pupils are equal, round, and reactive to light.  Neck:     Thyroid: No thyroid mass, thyromegaly or thyroid tenderness.     Vascular: No carotid bruit or JVD.     Trachea: Trachea and phonation normal.  Cardiovascular:     Rate and Rhythm: Normal rate and regular rhythm.     Chest Wall: PMI is not displaced.     Pulses: Normal pulses.     Heart sounds: Normal heart sounds. No murmur heard.    No friction rub. No gallop.  Pulmonary:     Effort: Pulmonary effort is normal. No respiratory distress.     Breath sounds: Normal breath sounds. No wheezing.  Abdominal:     General: Bowel sounds are normal. There is no distension or abdominal bruit.     Palpations: Abdomen is soft. There is no hepatomegaly or splenomegaly.     Tenderness: There is no abdominal tenderness. There is no right CVA tenderness or left  CVA tenderness.     Hernia: No hernia is present.  Musculoskeletal:        General: Normal range of motion.     Cervical back: Normal range of motion and neck supple.     Right lower leg: No edema.     Left lower leg: No edema.  Lymphadenopathy:     Cervical: No cervical adenopathy.  Skin:    General: Skin is warm and dry.     Capillary Refill: Capillary refill takes less than 2 seconds.     Coloration: Skin is not cyanotic, jaundiced or pale.     Findings: No rash.  Neurological:     General: No focal deficit present.     Mental Status: He is alert and oriented to person, place, and time.     Sensory: Sensation is intact.     Motor: Motor function is intact.     Coordination: Coordination is intact.     Gait: Gait is intact.     Deep Tendon Reflexes: Reflexes are normal and symmetric.  Psychiatric:        Attention and Perception: Attention and perception normal.        Mood and Affect: Mood and affect normal.        Speech: Speech normal.        Behavior: Behavior normal. Behavior is cooperative.        Thought Content: Thought content normal.        Cognition and Memory: Cognition and memory normal.        Judgment: Judgment normal.     Results for orders placed or performed in visit on 03/19/23  COVID-19, Flu A+B and RSV  Specimen: Nasopharyngeal Swab; Nasopharyngeal(NP) swabs in vial transport medium  Result Value Ref Range   SARS-CoV-2, NAA Not Detected Not Detected   Influenza A, NAA Not Detected Not Detected   Influenza B, NAA Not Detected Not Detected   RSV, NAA Not Detected Not Detected   Test Information: Comment   Rapid Strep Screen ([facility] ONLY)   Specimen: Other   Other  Result Value Ref Range   Strep Gp A Ag, IA W/Reflex Negative Negative  Culture, Group A Strep   Other  Result Value Ref Range   Strep A Culture CANCELED   Veritor Flu A/B Waived  Result Value Ref Range   Influenza A Negative Negative   Influenza B Negative Negative        Pertinent labs & imaging results that were available during my care of the patient were reviewed by me and considered in my medical decision making.  Assessment & Plan:  Lankford was seen today for gastroesophageal reflux.  Diagnoses and all orders for this visit:  Gastroesophageal reflux disease without esophagitis No red flags present. Diet discussed. Avoid fried, spicy, fatty, greasy, and acidic foods. Avoid caffeine, nicotine, and alcohol. Do not eat 2-3 hours before bedtime and stay upright for at least 1-2 hours after eating. Eat small frequent meals. Avoid NSAID's like motrin and aleve. Medications as prescribed. Report any new or worsening symptoms. Follow up as discussed or sooner if needed.   -     CBC with Differential/Platelet  High risk heterosexual behavior Will repeat STI testing today. Safe sex practices discussed in detail.  -     HepB+HepC+HIV Panel -     RPR -     Ct Ng M genitalium NAA, Urine  Anxiety Will check labs for potential underlying causes of anxiety such as thyroid disorder. Will trial below as needed for anxiety. Pt aware to report new, worsening, or persistent symptoms.  -     hydrOXYzine (VISTARIL) 25 MG capsule; Take 1 capsule (25 mg total) by mouth every 8 (eight) hours as needed for anxiety. Can take one at bedtime and repeat in 1 hour if needed -     CMP14+EGFR -     CBC with Differential/Platelet -     Thyroid Panel With TSH     Continue all other maintenance medications.  Follow up plan: Return in about 6 months (around 09/22/2023) for CPE.   Continue healthy lifestyle choices, including diet (rich in fruits, vegetables, and lean proteins, and low in salt and simple carbohydrates) and exercise (at least 30 minutes of moderate physical activity daily).  Educational handout given for crisis intervention hotline numbers.  The above assessment and management plan was discussed with the patient. The patient verbalized understanding of and has  agreed to the management plan. Patient is aware to call the clinic if they develop any new symptoms or if symptoms persist or worsen. Patient is aware when to return to the clinic for a follow-up visit. Patient educated on when it is appropriate to go to the emergency department.   Monia Pouch, FNP-C [facility] [phone]

## 2023-03-22 NOTE — Patient Instructions (Addendum)
If your symptoms worsen or you have thoughts of suicide/homicide, PLEASE SEEK IMMEDIATE MEDICAL ATTENTION.  You may always call:  National Suicide Hotline: 812-776-1715 [place]: (631)167-3311 Crisis Recovery in [place]: 606-313-3020  These are available 24 hours a day, 7 days a week.

## 2023-03-24 LAB — CMP14+EGFR
ALT: 39 IU/L (ref 0–44)
AST: 26 IU/L (ref 0–40)
Albumin/Globulin Ratio: 1.8 (ref 1.2–2.2)
Albumin: 4.7 g/dL (ref 4.1–5.1)
Alkaline Phosphatase: 107 IU/L (ref 44–121)
BUN/Creatinine Ratio: 10 (ref 9–20)
BUN: 10 mg/dL (ref 6–20)
Bilirubin Total: 0.5 mg/dL (ref 0.0–1.2)
CO2: 23 mmol/L (ref 20–29)
Calcium: 9.5 mg/dL (ref 8.7–10.2)
Chloride: 98 mmol/L (ref 96–106)
Creatinine, Ser: 1.02 mg/dL (ref 0.76–1.27)
Globulin, Total: 2.6 g/dL (ref 1.5–4.5)
Glucose: 90 mg/dL (ref 70–99)
Potassium: 4.5 mmol/L (ref 3.5–5.2)
Sodium: 137 mmol/L (ref 134–144)
Total Protein: 7.3 g/dL (ref 6.0–8.5)
eGFR: 99 mL/min/{1.73_m2} (ref >59)

## 2023-03-24 LAB — CBC WITH DIFFERENTIAL/PLATELET
Basophils Absolute: 0.1 10*3/uL (ref 0.0–0.2)
Basos: 1 %
EOS (ABSOLUTE): 0.3 10*3/uL (ref 0.0–0.4)
Eos: 4 %
Hematocrit: 49.1 % (ref 37.5–51.0)
Hemoglobin: 16.8 g/dL (ref 13.0–17.7)
Immature Grans (Abs): 0.1 10*3/uL (ref 0.0–0.1)
Immature Granulocytes: 1 %
Lymphocytes Absolute: 2 10*3/uL (ref 0.7–3.1)
Lymphs: 26 %
MCH: 31.6 pg (ref 26.6–33.0)
MCHC: 34.2 g/dL (ref 31.5–35.7)
MCV: 92 fL (ref 79–97)
Monocytes Absolute: 0.7 10*3/uL (ref 0.1–0.9)
Monocytes: 8 %
Neutrophils Absolute: 4.8 10*3/uL (ref 1.4–7.0)
Neutrophils: 60 %
Platelets: 295 10*3/uL (ref 150–450)
RBC: 5.32 x10E6/uL (ref 4.14–5.80)
RDW: 12.3 % (ref 11.6–15.4)
WBC: 8 10*3/uL (ref 3.4–10.8)

## 2023-03-24 LAB — HEPB+HEPC+HIV PANEL
HIV Screen 4th Generation wRfx: NONREACTIVE
Hep B C IgM: NEGATIVE
Hep B Core Total Ab: NEGATIVE
Hep B E Ab: NEGATIVE
Hep B E Ag: NEGATIVE
Hep B Surface Ab, Qual: NONREACTIVE
Hep C Virus Ab: NONREACTIVE
Hepatitis B Surface Ag: NEGATIVE

## 2023-03-24 LAB — THYROID PANEL WITH TSH
Free Thyroxine Index: 2.5 (ref 1.2–4.9)
T3 Uptake Ratio: 29 % (ref 24–39)
T4, Total: 8.5 ug/dL (ref 4.5–12.0)
TSH: 1.31 u[IU]/mL (ref 0.450–4.500)

## 2023-03-24 LAB — CT NG M GENITALIUM NAA, URINE
Chlamydia trachomatis, NAA: NEGATIVE
Mycoplasma genitalium NAA: NEGATIVE
Neisseria gonorrhoeae, NAA: NEGATIVE

## 2023-03-24 LAB — RPR: RPR Ser Ql: NONREACTIVE

## 2023-03-27 ENCOUNTER — Ambulatory Visit: Payer: BC Managed Care – PPO | Admitting: Family Medicine

## 2023-04-05 ENCOUNTER — Other Ambulatory Visit: Payer: Self-pay | Admitting: Family Medicine

## 2023-04-05 ENCOUNTER — Ambulatory Visit: Payer: BC Managed Care – PPO | Admitting: Family Medicine

## 2023-04-05 DIAGNOSIS — F419 Anxiety disorder, unspecified: Secondary | ICD-10-CM

## 2023-04-06 ENCOUNTER — Encounter: Payer: Self-pay | Admitting: Family Medicine

## 2023-04-06 NOTE — Telephone Encounter (Signed)
Prescribed 90 at office visit on 03/22/23.  Pharmacy is wanting to do 90 day supply of 270.

## 2023-04-12 ENCOUNTER — Ambulatory Visit: Payer: BC Managed Care – PPO | Admitting: Family Medicine

## 2023-04-19 ENCOUNTER — Encounter: Payer: Self-pay | Admitting: Family Medicine

## 2023-04-19 ENCOUNTER — Ambulatory Visit: Payer: BC Managed Care – PPO | Admitting: Family Medicine

## 2023-04-19 VITALS — BP 129/80 | HR 100 | Temp 98.6°F | Ht 68.0 in | Wt 221.2 lb

## 2023-04-19 DIAGNOSIS — M25562 Pain in left knee: Secondary | ICD-10-CM

## 2023-04-19 DIAGNOSIS — G8929 Other chronic pain: Secondary | ICD-10-CM

## 2023-04-19 NOTE — Progress Notes (Signed)
Subjective:  Patient ID: Philip Hughes, male    DOB: 08/01/1986, 37 y.o.   MRN: [id number]  Patient Care Team: Sonny Masters, FNP as PCP - General (Family Medicine)   Chief Complaint:  Knee Pain (Left knee pain x 3 years. Patient was working and felt you knee pop and has pain since then depending on what he does. Needs referral to biotech in [place] )   HPI: Philip Hughes is a 37 y.o. male presenting on 04/19/2023 for Knee Pain (Left knee pain x 3 years. Patient was working and felt you knee pop and has pain since then depending on what he does. Needs referral to biotech in [place] )   Knee Pain  Incident onset: over 3 years ago, prior injury. The incident occurred at work. The injury mechanism was a twisting injury. The pain is present in the left knee. The quality of the pain is described as aching and burning. The pain is moderate. The pain has been Intermittent since onset. Pertinent negatives include no inability to bear weight, loss of motion, loss of sensation, muscle weakness, numbness or tingling. He reports no foreign bodies present. The symptoms are aggravated by movement and weight bearing. He has tried immobilization, acetaminophen and rest for the symptoms. The treatment provided mild relief.    Relevant past medical, surgical, family, and social history reviewed and updated as indicated.  Allergies and medications reviewed and updated. Data reviewed: Chart in Epic.   Past Medical History:  Diagnosis Date   History of meningitis    age 37   Migraines     Past Surgical History:  Procedure Laterality Date   KNEE ARTHROSCOPY Left 06/11/2015   Procedure: LEFT KNEE ARTHROSCOPY WITH LOOSE BODY REMOVAL;  Surgeon: Vickki Hearing, MD;  Location: [facility];  Service: Orthopedics;  Laterality: Left;   TONSILLECTOMY      Social History   Socioeconomic History   Marital status: Single    Spouse name: Not on file   Number of children: Not on file   Years of  education: Not on file   Highest education level: Not on file  Occupational History   Not on file  Tobacco Use   Smoking status: Every Day    Packs/day: 0.50    Years: 6.00    Additional pack years: 0.00    Total pack years: 3.00    Types: Cigarettes   Smokeless tobacco: Never  Vaping Use   Vaping Use: Never used  Substance and Sexual Activity   Alcohol use: Yes    Alcohol/week: 5.0 - 6.0 standard drinks of alcohol    Types: 5 - 6 Cans of beer per week   Drug use: No   Sexual activity: Yes    Birth control/protection: None  Other Topics Concern   Not on file  Social History Narrative   Not on file   Social Determinants of Health   Financial Resource Strain: Not on file  Food Insecurity: Not on file  Transportation Needs: Not on file  Physical Activity: Not on file  Stress: Not on file  Social Connections: Not on file  Intimate Partner Violence: Not on file    Outpatient Encounter Medications as of 04/19/2023  Medication Sig   acetaminophen (TYLENOL) 500 MG tablet Take 1 tablet (500 mg total) by mouth every 6 (six) hours as needed.   hydrOXYzine (VISTARIL) 25 MG capsule TAKE 1 CAPSULE EVERY 8 HOURS AS NEEDED FOR ANXIETY. CAN TAKE ONE  AT BEDTIME AND REPEAT IN 1 HOUR IF NEEDED   pantoprazole (PROTONIX) 40 MG tablet TAKE 1 TABLET BY MOUTH EVERY DAY   fluticasone (FLONASE) 50 MCG/ACT nasal spray Place 2 sprays into both nostrils daily. (Patient not taking: Reported on 04/19/2023)   No facility-administered encounter medications on file as of 04/19/2023.    No Known Allergies  Review of Systems  Constitutional:  Negative for activity change, appetite change, chills, diaphoresis, fatigue, fever and unexpected weight change.  HENT: Negative.    Eyes: Negative.  Negative for photophobia and visual disturbance.  Respiratory:  Negative for cough, chest tightness and shortness of breath.   Cardiovascular:  Negative for chest pain, palpitations and leg swelling.   Gastrointestinal:  Negative for abdominal pain, blood in stool, constipation, diarrhea, nausea and vomiting.  Endocrine: Negative.   Genitourinary:  Negative for decreased urine volume, difficulty urinating, dysuria, frequency and urgency.  Musculoskeletal:  Positive for arthralgias and joint swelling. Negative for myalgias.  Skin: Negative.   Allergic/Immunologic: Negative.   Neurological:  Negative for dizziness, tingling, tremors, seizures, syncope, facial asymmetry, speech difficulty, weakness, light-headedness, numbness and headaches.  Hematological: Negative.   Psychiatric/Behavioral:  Negative for confusion, hallucinations, sleep disturbance and suicidal ideas.   All other systems reviewed and are negative.       Objective:  BP 129/80   Pulse 100   Temp 98.6 F (37 C) (Temporal)   Ht  (1.727 m)   Wt 221 lb 3.2 oz (100.3 kg)   SpO2 94%   BMI 33.63 kg/m    Wt Readings from Last 3 Encounters:  04/19/23 221 lb 3.2 oz (100.3 kg)  03/22/23 222 lb 3.2 oz (100.8 kg)  03/19/23 220 lb (99.8 kg)    Physical Exam Vitals and nursing note reviewed.  Constitutional:      General: He is not in acute distress.    Appearance: Normal appearance. He is obese. He is not ill-appearing, toxic-appearing or diaphoretic.  HENT:     Head: Normocephalic and atraumatic.     Mouth/Throat:     Mouth: Mucous membranes are moist.  Eyes:     Conjunctiva/sclera: Conjunctivae normal.     Pupils: Pupils are equal, round, and reactive to light.  Cardiovascular:     Rate and Rhythm: Normal rate.     Pulses: Normal pulses.  Pulmonary:     Effort: Pulmonary effort is normal.  Musculoskeletal:     Left upper leg: Normal.     Left knee: No swelling, deformity, effusion, erythema, ecchymosis, lacerations, bony tenderness or crepitus. Normal range of motion. Tenderness present. LCL laxity present. No MCL laxity, ACL laxity or PCL laxity.Abnormal patellar mobility. Normal alignment and normal  meniscus. Normal pulse.     Right lower leg: No edema.     Left lower leg: Normal. No edema.  Skin:    General: Skin is warm and dry.     Capillary Refill: Capillary refill takes less than 2 seconds.  Neurological:     General: No focal deficit present.     Mental Status: He is alert and oriented to person, place, and time.  Psychiatric:        Mood and Affect: Mood normal.        Behavior: Behavior normal.        Thought Content: Thought content normal.        Judgment: Judgment normal.     Results for orders placed or performed in visit on 03/22/23  CMP14+EGFR  Result  Value Ref Range   Glucose 90 70 - 99 mg/dL   BUN 10 6 - 20 mg/dL   Creatinine, Ser 1.61 0.76 - 1.27 mg/dL   eGFR 99 >09 UE/AVW/0.98   BUN/Creatinine Ratio 10 9 - 20   Sodium 137 134 - 144 mmol/L   Potassium 4.5 3.5 - 5.2 mmol/L   Chloride 98 96 - 106 mmol/L   CO2 23 20 - 29 mmol/L   Calcium 9.5 8.7 - 10.2 mg/dL   Total Protein 7.3 6.0 - 8.5 g/dL   Albumin 4.7 4.1 - 5.1 g/dL   Globulin, Total 2.6 1.5 - 4.5 g/dL   Albumin/Globulin Ratio 1.8 1.2 - 2.2   Bilirubin Total 0.5 0.0 - 1.2 mg/dL   Alkaline Phosphatase 107 44 - 121 IU/L   AST 26 0 - 40 IU/L   ALT 39 0 - 44 IU/L  CBC with Differential/Platelet  Result Value Ref Range   WBC 8.0 3.4 - 10.8 x10E3/uL   RBC 5.32 4.14 - 5.80 x10E6/uL   Hemoglobin 16.8 13.0 - 17.7 g/dL   Hematocrit 11.9 14.7 - 51.0 %   MCV 92 79 - 97 fL   MCH 31.6 26.6 - 33.0 pg   MCHC 34.2 31.5 - 35.7 g/dL   RDW 82.9 56.2 - 13.0 %   Platelets 295 150 - 450 x10E3/uL   Neutrophils 60 Not Estab. %   Lymphs 26 Not Estab. %   Monocytes 8 Not Estab. %   Eos 4 Not Estab. %   Basos 1 Not Estab. %   Neutrophils Absolute 4.8 1.4 - 7.0 x10E3/uL   Lymphocytes Absolute 2.0 0.7 - 3.1 x10E3/uL   Monocytes Absolute 0.7 0.1 - 0.9 x10E3/uL   EOS (ABSOLUTE) 0.3 0.0 - 0.4 x10E3/uL   Basophils Absolute 0.1 0.0 - 0.2 x10E3/uL   Immature Granulocytes 1 Not Estab. %   Immature Grans (Abs) 0.1 0.0 -  0.1 x10E3/uL  HepB+HepC+HIV Panel  Result Value Ref Range   Hepatitis B Surface Ag Negative Negative   Hep B E Ag Negative Negative   Hep B C IgM Negative Negative   Hep B Core Total Ab Negative Negative   Hep B E Ab Negative Negative   Hep B Surface Ab, Qual Non Reactive    Hep C Virus Ab Non Reactive Non Reactive   HIV Screen 4th Generation wRfx Non Reactive Non Reactive  RPR  Result Value Ref Range   RPR Ser Ql Non Reactive Non Reactive  Thyroid Panel With TSH  Result Value Ref Range   TSH 1.310 0.450 - 4.500 uIU/mL   T4, Total 8.5 4.5 - 12.0 ug/dL   T3 Uptake Ratio 29 24 - 39 %   Free Thyroxine Index 2.5 1.2 - 4.9  Ct Ng M genitalium NAA, Urine  Result Value Ref Range   Mycoplasma genitalium NAA Negative Negative   Chlamydia trachomatis, NAA Negative Negative   Neisseria gonorrhoeae, NAA Negative Negative       Pertinent labs & imaging results that were available during my care of the patient were reviewed by me and considered in my medical decision making.  Assessment & Plan:  Natthew was seen today for knee pain.  Diagnoses and all orders for this visit:  Chronic pain of left knee Prior injury. Has not seen ortho in several years. Will place referral to get reestablished. Will refer to biotech for brace fitting.  -     Ambulatory referral to Orthopedic Surgery     Continue  all other maintenance medications.  Follow up plan: No follow-ups on file.   Continue healthy lifestyle choices, including diet (rich in fruits, vegetables, and lean proteins, and low in salt and simple carbohydrates) and exercise (at least 30 minutes of moderate physical activity daily).  Educational handout given for knee pain  The above assessment and management plan was discussed with the patient. The patient verbalized understanding of and has agreed to the management plan. Patient is aware to call the clinic if they develop any new symptoms or if symptoms persist or worsen. Patient is  aware when to return to the clinic for a follow-up visit. Patient educated on when it is appropriate to go to the emergency department.   Kari Baars, FNP-C [facility] [phone]

## 2023-05-03 ENCOUNTER — Ambulatory Visit: Payer: BLUE CROSS/BLUE SHIELD | Admitting: Orthopedic Surgery

## 2023-05-10 ENCOUNTER — Ambulatory Visit: Payer: BLUE CROSS/BLUE SHIELD | Admitting: Orthopedic Surgery

## 2023-05-24 ENCOUNTER — Ambulatory Visit: Payer: BLUE CROSS/BLUE SHIELD | Admitting: Orthopedic Surgery

## 2023-06-04 ENCOUNTER — Other Ambulatory Visit: Payer: Self-pay | Admitting: Family Medicine

## 2023-06-04 DIAGNOSIS — R1013 Epigastric pain: Secondary | ICD-10-CM

## 2023-07-12 ENCOUNTER — Telehealth: Payer: Self-pay | Admitting: Family Medicine

## 2023-07-12 DIAGNOSIS — F419 Anxiety disorder, unspecified: Secondary | ICD-10-CM

## 2023-07-12 DIAGNOSIS — R1013 Epigastric pain: Secondary | ICD-10-CM

## 2023-07-12 MED ORDER — PANTOPRAZOLE SODIUM 40 MG PO TBEC: 40.0000 mg | DELAYED_RELEASE_TABLET | Freq: Every day | ORAL | 0 refills | Status: DC

## 2023-07-12 MED ORDER — HYDROXYZINE PAMOATE 25 MG PO CAPS: ORAL_CAPSULE | ORAL | 0 refills | Status: DC

## 2023-07-12 NOTE — Telephone Encounter (Signed)
Patient aware that med sent to pharmacy

## 2023-07-12 NOTE — Telephone Encounter (Signed)
  Prescription Request  07/12/2023  Is this a "Controlled Substance" medicine? One is  Have you seen your PCP in the last 2 weeks? no  If YES, route message to pool  -  If NO, patient needs to be scheduled for appointment.  What is the name of the medication or equipment? Protonix & Anxiety med (can't remember the name per pt)  Have you contacted your pharmacy to request a refill? no   Which pharmacy would you like this sent to? [facility]   Patient notified that their request is being sent to the clinical staff for review and that they should receive a response within 2 business days.    He has an appt in October w/[facility].  Please call pt. He doesn't have any refills on his bottle.

## 2023-09-25 ENCOUNTER — Encounter: Payer: BC Managed Care – PPO | Admitting: Family Medicine

## 2023-11-13 ENCOUNTER — Other Ambulatory Visit: Payer: BLUE CROSS/BLUE SHIELD

## 2023-11-14 ENCOUNTER — Ambulatory Visit: Payer: BLUE CROSS/BLUE SHIELD

## 2023-11-20 ENCOUNTER — Ambulatory Visit: Payer: BLUE CROSS/BLUE SHIELD | Admitting: Family Medicine

## 2023-12-10 ENCOUNTER — Ambulatory Visit (INDEPENDENT_AMBULATORY_CARE_PROVIDER_SITE_OTHER): Payer: BLUE CROSS/BLUE SHIELD | Admitting: Family Medicine

## 2023-12-10 ENCOUNTER — Encounter: Payer: Self-pay | Admitting: Family Medicine

## 2023-12-10 VITALS — BP 137/83 | HR 87 | Temp 98.1°F | Ht 68.0 in | Wt 214.8 lb

## 2023-12-10 DIAGNOSIS — M25511 Pain in right shoulder: Secondary | ICD-10-CM

## 2023-12-10 DIAGNOSIS — Z7251 High risk heterosexual behavior: Secondary | ICD-10-CM | POA: Diagnosis not present

## 2023-12-10 LAB — LIPID PANEL

## 2023-12-10 NOTE — Progress Notes (Signed)
Subjective:  Patient ID: Philip Hughes, male    DOB: June 10, 1986  Age: 37 y.o. MRN: [id number]  CC: Exposure to STD   HPI Philip Hughes presents for sexually active with 16-17 people this year. Concerned about possible STD exposure. No sx of STD although he notes that he had a- had congested nose last month. Right knee & elbow stiff.  The joints have not been hot or painful or swollen.  Additionally he says his concern relates to the fact that condoms might of broken.  He tends to hook up parties and does not really know the people that he has sex with.     12/10/2023    3:27 PM 03/22/2023    3:23 PM 03/19/2023   10:26 AM  Depression screen PHQ 2/9  Decreased Interest 1 0 1  Down, Depressed, Hopeless 0 0 0  PHQ - 2 Score 1 0 1  Altered sleeping 2 2 0  Tired, decreased energy 2 1 1   Change in appetite 2 1 1   Feeling bad or failure about yourself  0 0 0  Trouble concentrating 1 0 1  Moving slowly or fidgety/restless 0 0 0  Suicidal thoughts 0 0 0  PHQ-9 Score 8 4 4   Difficult doing work/chores Somewhat difficult Not difficult at all Somewhat difficult    History Philip Hughes has a past medical history of History of meningitis and Migraines.   He has a past surgical history that includes Tonsillectomy and Knee arthroscopy (Left, 06/11/2015).   His family history includes Asthma in his mother; Cancer in an other family member; Diabetes in his maternal grandmother and mother; Hypertension in an other family member; Leukemia in his paternal grandmother.He reports that he has been smoking cigarettes. He has a 3 pack-year smoking history. He has never used smokeless tobacco. He reports current alcohol use of about 5.0 - 6.0 standard drinks of alcohol per week. He reports that he does not use drugs.    ROS Review of Systems  Constitutional:  Negative for fever.  Respiratory:  Negative for shortness of breath.   Cardiovascular:  Negative for chest pain.  Musculoskeletal:  Negative for  arthralgias.  Skin:  Negative for rash.    Objective:  BP 137/83   Pulse 87   Temp 98.1 F (36.7 C)   Ht 5\' 8"  (1.727 m)   Wt 214 lb 12.8 oz (97.4 kg)   SpO2 95%   BMI 32.66 kg/m   BP Readings from Last 3 Encounters:  12/10/23 137/83  04/19/23 129/80  03/22/23 135/87    Wt Readings from Last 3 Encounters:  12/10/23 214 lb 12.8 oz (97.4 kg)  04/19/23 221 lb 3.2 oz (100.3 kg)  03/22/23 222 lb 3.2 oz (100.8 kg)     Physical Exam Vitals reviewed.  Constitutional:      Appearance: He is well-developed.  HENT:     Head: Normocephalic and atraumatic.     Right Ear: External ear normal.     Left Ear: External ear normal.     Mouth/Throat:     Pharynx: No oropharyngeal exudate or posterior oropharyngeal erythema.  Eyes:     Pupils: Pupils are equal, round, and reactive to light.  Cardiovascular:     Rate and Rhythm: Normal rate and regular rhythm.     Heart sounds: No murmur heard. Pulmonary:     Effort: No respiratory distress.     Breath sounds: Normal breath sounds.  Musculoskeletal:     Cervical back:  Normal range of motion and neck supple.  Neurological:     Mental Status: He is alert and oriented to person, place, and time.       Assessment & Plan:   Philip Hughes was seen today for exposure to std.  Diagnoses and all orders for this visit:  High risk heterosexual behavior -     CBC with Differential/Platelet -     CMP14+EGFR -     Ct Ng M genitalium NAA, Urine -     HepB+HepC+HIV Panel -     Lipid panel -     HSV(herpes simplex vrs) 1+2 ab-IgG -     RPR -     HIV antibody (with reflex) -     HSV 1 and 2 Ab, IgG  Pain in joint of right shoulder       I am having Philip Hughes maintain his acetaminophen, fluticasone, hydrOXYzine, and pantoprazole.  Allergies as of 12/10/2023   No Known Allergies      Medication List        Accurate as of December 10, 2023  5:56 PM. If you have any questions, ask your nurse or doctor.           acetaminophen 500 MG tablet Commonly known as: TYLENOL Take 1 tablet (500 mg total) by mouth every 6 (six) hours as needed.   fluticasone 50 MCG/ACT nasal spray Commonly known as: FLONASE Place 2 sprays into both nostrils daily.   hydrOXYzine 25 MG capsule Commonly known as: VISTARIL Take 1 capsule every 8 hours as needed for anxiety. Can take one at bedtime and repeat in 1 hour if needed   pantoprazole 40 MG tablet Commonly known as: PROTONIX Take 1 tablet (40 mg total) by mouth daily.       I believe the pains are minor and not related to STD based on presentation.  Follow-up: Return if symptoms worsen or fail to improve.  Mechele Claude, M.D.

## 2023-12-11 LAB — HEPB+HEPC+HIV PANEL

## 2023-12-11 LAB — CMP14+EGFR
ALT: 29 IU/L (ref 0–44)
AST: 20 IU/L (ref 0–40)
Albumin: 4.6 g/dL (ref 4.1–5.1)
Alkaline Phosphatase: 83 IU/L (ref 44–121)
BUN/Creatinine Ratio: 14 (ref 9–20)
BUN: 13 mg/dL (ref 6–20)
Bilirubin Total: 1.4 mg/dL — ABNORMAL HIGH (ref 0.0–1.2)
CO2: 23 mmol/L (ref 20–29)
Calcium: 9.1 mg/dL (ref 8.7–10.2)
Chloride: 103 mmol/L (ref 96–106)
Creatinine, Ser: 0.91 mg/dL (ref 0.76–1.27)
Globulin, Total: 2.1 g/dL (ref 1.5–4.5)
Glucose: 88 mg/dL (ref 70–99)
Potassium: 4.1 mmol/L (ref 3.5–5.2)
Sodium: 140 mmol/L (ref 134–144)
Total Protein: 6.7 g/dL (ref 6.0–8.5)
eGFR: 113 mL/min/{1.73_m2} (ref >59)

## 2023-12-11 LAB — CBC WITH DIFFERENTIAL/PLATELET
Basophils Absolute: 0 10*3/uL (ref 0.0–0.2)
Basos: 1 %
EOS (ABSOLUTE): 0.3 10*3/uL (ref 0.0–0.4)
Eos: 6 %
Hematocrit: 49.6 % (ref 37.5–51.0)
Hemoglobin: 16.6 g/dL (ref 13.0–17.7)
Immature Grans (Abs): 0 10*3/uL (ref 0.0–0.1)
Immature Granulocytes: 0 %
Lymphocytes Absolute: 1.5 10*3/uL (ref 0.7–3.1)
Lymphs: 29 %
MCH: 30.9 pg (ref 26.6–33.0)
MCHC: 33.5 g/dL (ref 31.5–35.7)
MCV: 92 fL (ref 79–97)
Monocytes Absolute: 0.5 10*3/uL (ref 0.1–0.9)
Monocytes: 10 %
Neutrophils Absolute: 2.9 10*3/uL (ref 1.4–7.0)
Neutrophils: 54 %
Platelets: 256 10*3/uL (ref 150–450)
RBC: 5.37 x10E6/uL (ref 4.14–5.80)
RDW: 12.2 % (ref 11.6–15.4)
WBC: 5.3 10*3/uL (ref 3.4–10.8)

## 2023-12-11 LAB — LIPID PANEL
Cholesterol, Total: 169 mg/dL (ref 100–199)
HDL: 31 mg/dL — ABNORMAL LOW (ref >39)
LDL CALC COMMENT:: 5.5 ratio — ABNORMAL HIGH (ref 0.0–5.0)
LDL Chol Calc (NIH): 119 mg/dL — ABNORMAL HIGH (ref 0–99)
Triglycerides: 101 mg/dL (ref 0–149)
VLDL Cholesterol Cal: 19 mg/dL (ref 5–40)

## 2023-12-11 LAB — CT NG M GENITALIUM NAA, URINE
Chlamydia trachomatis, NAA: NEGATIVE
Mycoplasma genitalium NAA: NEGATIVE
Neisseria gonorrhoeae, NAA: NEGATIVE

## 2023-12-11 LAB — RPR

## 2023-12-11 LAB — HSV 1 AND 2 AB, IGG
HSV 1 Glycoprotein G Ab, IgG: REACTIVE — AB
HSV 2 IgG, Type Spec: NONREACTIVE

## 2023-12-21 ENCOUNTER — Encounter: Payer: Self-pay | Admitting: Family

## 2023-12-21 ENCOUNTER — Ambulatory Visit: Payer: Self-pay | Admitting: Family Medicine

## 2023-12-21 ENCOUNTER — Telehealth (INDEPENDENT_AMBULATORY_CARE_PROVIDER_SITE_OTHER): Payer: BLUE CROSS/BLUE SHIELD | Admitting: Family

## 2023-12-21 DIAGNOSIS — B9689 Other specified bacterial agents as the cause of diseases classified elsewhere: Secondary | ICD-10-CM | POA: Diagnosis not present

## 2023-12-21 DIAGNOSIS — J208 Acute bronchitis due to other specified organisms: Secondary | ICD-10-CM | POA: Diagnosis not present

## 2023-12-21 MED ORDER — BENZONATATE 200 MG PO CAPS: 200.0000 mg | ORAL_CAPSULE | Freq: Three times a day (TID) | ORAL | 1 refills | Status: DC | PRN

## 2023-12-21 MED ORDER — AZITHROMYCIN 250 MG PO TABS: ORAL_TABLET | ORAL | 0 refills | Status: DC

## 2023-12-21 NOTE — Progress Notes (Deleted)
   Subjective:    Patient ID: [name], male    DOB: [birth year], 37 y.o.   MRN: [id number]  HPI    Review of Systems  Social History   Socioeconomic History   Marital status: Single    Spouse name: Not on file   Number of children: Not on file   Years of education: Not on file   Highest education level: Not on file  Occupational History   Not on file  Tobacco Use   Smoking status: Every Day    Current packs/day: 0.50    Average packs/day: 0.5 packs/day for 6.0 years (3.0 ttl pk-yrs)    Types: Cigarettes   Smokeless tobacco: Never  Vaping Use   Vaping status: Never Used  Substance and Sexual Activity   Alcohol use: Yes    Alcohol/week: 5.0 - 6.0 standard drinks of alcohol    Types: 5 - 6 Cans of beer per week   Drug use: No   Sexual activity: Yes    Birth control/protection: None  Other Topics Concern   Not on file  Social History Narrative   Not on file   Social Drivers of Health   Financial Resource Strain: Not on file  Food Insecurity: Not on file  Transportation Needs: Not on file  Physical Activity: Not on file  Stress: Not on file  Social Connections: Not on file   Family History  Problem Relation Age of Onset   Diabetes Mother    Asthma Mother    Diabetes Maternal Grandmother    Leukemia Paternal Grandmother    Cancer Other    Hypertension Other         Objective:   Physical Exam    There were no vitals taken for this visit.     Assessment & Plan:  TOSHIRO OHTA comes in today with chief complaint of No chief complaint on file.   Diagnosis and orders addressed:  ***  Labs pending Continue current medications  Keep follow up with specialists  Health Maintenance reviewed Diet and exercise encouraged  No follow-ups on file.    [name], FNP

## 2023-12-21 NOTE — Telephone Encounter (Signed)
  Chief Complaint: cough Symptoms: productive cough, fever, fatigue, SOB when coughing Frequency: 4 days Pertinent Negatives: Patient denies Chest pain, blood in sputum,  Disposition: [] ED /[] Urgent Care (no appt availability in office) / [x] Appointment(In office/virtual)/ []  [facility]/ [] Home Care/ [] Refused Recommended Disposition /[] [facility]/ []  Follow-up with PCP Additional Notes: Pt called stating he has productive cough with green phlegm, intermittent fever, fatigue, SOB with coughing x 4 days. Pt reports he has been around multiple sick people over the last several weeks. Describes the cough as nagging, constant, but worse at times. States he has some rib pain and back pain from the constant cough. Denies SOB and fever at this time, CP, blood in sputum. Per protocol, pt to be evaluated within 4 hours. Next available appt in clinic 12/31/23 @ 0815. This RN advised the pt to be seen in urgent care, however, he states he cannot afford the copay for that visit. This RN contacted [facility], Chelsea was connected with the pt and able to schedule for video visit with Ms. Hawks today. Care advice reviewed, pt verbalized understanding. Alerting PCP for review.   Copied from CRM [id number]. Topic: Clinical - Red Word Triage >> Dec 21, 2023  8:31 AM Ivette P wrote: Red Word that prompted transfer to Nurse Triage: Temperature, last night was 102. Short of breath, cough. Reason for Disposition  Wheezing is present  Answer Assessment - Initial Assessment Questions 1. ONSET: "When did the cough begin?"      4 days 2. SEVERITY: "How bad is the cough today?"      Pretty constant, comes in spurts but steady throughout the day 3. SPUTUM: "Describe the color of your sputum" (none, dry cough; clear, white, yellow, green)     Green and white 4. HEMOPTYSIS: "Are you coughing up any blood?" If so ask: "How much?" (flecks, streaks, tablespoons, etc.)     Denies 5. DIFFICULTY BREATHING: "Are  you having difficulty breathing?" If Yes, ask: "How bad is it?" (e.g., mild, moderate, severe)    - MILD: No SOB at rest, mild SOB with walking, speaks normally in sentences, can lie down, no retractions, pulse < 100.    - MODERATE: SOB at rest, SOB with minimal exertion and prefers to sit, cannot lie down flat, speaks in phrases, mild retractions, audible wheezing, pulse 100-120.    - SEVERE: Very SOB at rest, speaks in single words, struggling to breathe, sitting hunched forward, retractions, pulse > 120      Mild 6. FEVER: "Do you have a fever?" If Yes, ask: "What is your temperature, how was it measured, and when did it start?"     100.68F, took dayquil and nyquil 7. CARDIAC HISTORY: "Do you have any history of heart disease?" (e.g., heart attack, congestive heart failure)      denies 8. LUNG HISTORY: "Do you have any history of lung disease?"  (e.g., pulmonary embolus, asthma, emphysema)     denies 9. PE RISK FACTORS: "Do you have a history of blood clots?" (or: recent major surgery, recent prolonged travel, bedridden)     denies 10. OTHER SYMPTOMS: "Do you have any other symptoms?" (e.g., runny nose, wheezing, chest pain)       Runny nose, wheezing, head congestion  12. TRAVEL: "Have you traveled out of the country in the last month?" (e.g., travel history, exposures)       denies  Protocols used: Cough - Acute Productive-A-AH

## 2023-12-21 NOTE — Patient Instructions (Signed)

## 2023-12-21 NOTE — Progress Notes (Signed)
Virtual Visit Consent   Philip Hughes, you are scheduled for a virtual visit with a [facility] provider today. Just as with appointments in the office, your consent must be obtained to participate. Your consent will be active for this visit and any virtual visit you may have with one of our providers in the next 365 days. If you have a MyChart account, a copy of this consent can be sent to you electronically.  As this is a virtual visit, video technology does not allow for your provider to perform a traditional examination. This may limit your provider's ability to fully assess your condition. If your provider identifies any concerns that need to be evaluated in person or the need to arrange testing (such as labs, EKG, etc.), we will make arrangements to do so. Although advances in technology are sophisticated, we cannot ensure that it will always work on either your end or our end. If the connection with a video visit is poor, the visit may have to be switched to a telephone visit. With either a video or telephone visit, we are not always able to ensure that we have a secure connection.  By engaging in this virtual visit, you consent to the provision of healthcare and authorize for your insurance to be billed (if applicable) for the services provided during this visit. Depending on your insurance coverage, you may receive a charge related to this service.  I need to obtain your verbal consent now. Are you willing to proceed with your visit today? [name] has provided verbal consent on 12/21/2023 for a virtual visit (video or telephone). [name], FNP  Date: 12/21/2023 10:29 AM  Virtual Visit via Video Note   I, [name], connected with  [name]  ([id number], [birth year]) on 12/21/23 at 10:25 AM EST by a video-enabled telemedicine application and verified that I am speaking with the correct person using two identifiers.  Location: Patient: Virtual Visit Location Patient:  Home Provider: Virtual Visit Location Provider: Home Office   I discussed the limitations of evaluation and management by telemedicine and the availability of in person appointments. The patient expressed understanding and agreed to proceed.    History of Present Illness: Philip Hughes is a 37 y.o. who identifies as a male who was assigned male at birth, and is being seen today for cough, fever,  and chest congestion that started 4 days ago.   HPI: Cough This is a new problem. The current episode started in the past 7 days. The problem has been gradually worsening. The problem occurs every few minutes. The cough is Productive of sputum and productive of purulent sputum. Associated symptoms include chills, ear congestion, ear pain, a fever, headaches, myalgias, nasal congestion, postnasal drip, rhinorrhea, shortness of breath and wheezing. Risk factors: quit smoking this week. He has tried rest for the symptoms. The treatment provided mild relief.    Problems:  Patient Active Problem List   Diagnosis Date Noted   Gastroesophageal reflux disease without esophagitis 09/05/2022    Allergies: No Known Allergies Medications:  Current Outpatient Medications:    azithromycin (ZITHROMAX) 250 MG tablet, Take 500 mg once, then 250 mg for four days, Disp: 6 tablet, Rfl: 0   benzonatate (TESSALON) 200 MG capsule, Take 1 capsule (200 mg total) by mouth 3 (three) times daily as needed., Disp: 30 capsule, Rfl: 1   pantoprazole (PROTONIX) 40 MG tablet, Take 1 tablet (40 mg total) by mouth daily., Disp: 90 tablet, Rfl:  0  Observations/Objective: Patient is well-developed, well-nourished in no acute distress.  Resting comfortably  at home.  Head is normocephalic, atraumatic.  No labored breathing.  Speech is clear and coherent with logical content.  Patient is alert and oriented at baseline.  Nasal congestion  Coarse nonproductive cough  Assessment and Plan: 1. Acute bacterial bronchitis  (Primary) - azithromycin (ZITHROMAX) 250 MG tablet; Take 500 mg once, then 250 mg for four days  Dispense: 6 tablet; Refill: 0 - benzonatate (TESSALON) 200 MG capsule; Take 1 capsule (200 mg total) by mouth 3 (three) times daily as needed.  Dispense: 30 capsule; Refill: 1  - Take meds as prescribed - Use a cool mist humidifier  -Use saline nose sprays frequently -Force fluids -For any cough or congestion  Use plain Mucinex- regular strength or max strength is fine -For fever or aces or pains- take tylenol or ibuprofen. -Throat lozenges if help - Work note given -Follow up if symptoms worsen or do not improve   Follow Up Instructions: I discussed the assessment and treatment plan with the patient. The patient was provided an opportunity to ask questions and all were answered. The patient agreed with the plan and demonstrated an understanding of the instructions.  A copy of instructions were sent to the patient via MyChart unless otherwise noted below.     The patient was advised to call back or seek an in-person evaluation if the symptoms worsen or if the condition fails to improve as anticipated.    [name], FNP

## 2024-01-15 ENCOUNTER — Encounter: Payer: BC Managed Care – PPO | Admitting: Family Medicine

## 2024-01-30 ENCOUNTER — Ambulatory Visit: Payer: BLUE CROSS/BLUE SHIELD | Admitting: Family Medicine

## 2024-02-01 ENCOUNTER — Other Ambulatory Visit: Payer: Self-pay | Admitting: Family Medicine

## 2024-02-01 DIAGNOSIS — R1013 Epigastric pain: Secondary | ICD-10-CM

## 2024-02-05 ENCOUNTER — Ambulatory Visit: Payer: BLUE CROSS/BLUE SHIELD | Admitting: Family Medicine

## 2024-02-21 ENCOUNTER — Ambulatory Visit: Payer: BLUE CROSS/BLUE SHIELD | Admitting: Family Medicine

## 2024-02-21 ENCOUNTER — Encounter: Payer: Self-pay | Admitting: Family Medicine

## 2024-02-21 VITALS — BP 127/84 | HR 106 | Temp 97.8°F | Ht 68.0 in | Wt 219.2 lb

## 2024-02-21 DIAGNOSIS — J069 Acute upper respiratory infection, unspecified: Secondary | ICD-10-CM

## 2024-02-21 DIAGNOSIS — R509 Fever, unspecified: Secondary | ICD-10-CM

## 2024-02-21 DIAGNOSIS — M791 Myalgia, unspecified site: Secondary | ICD-10-CM | POA: Diagnosis not present

## 2024-02-21 DIAGNOSIS — R0981 Nasal congestion: Secondary | ICD-10-CM

## 2024-02-21 LAB — VERITOR FLU A/B WAIVED
Influenza A: NEGATIVE
Influenza B: NEGATIVE

## 2024-02-21 MED ORDER — PREDNISONE 20 MG PO TABS: 40.0000 mg | ORAL_TABLET | Freq: Every day | ORAL | 0 refills | Status: AC

## 2024-02-21 MED ORDER — CHLORPHEN-PE-ACETAMINOPHEN 4-10-325 MG PO TABS: 1.0000 | ORAL_TABLET | Freq: Four times a day (QID) | ORAL | 0 refills | Status: DC | PRN

## 2024-02-21 NOTE — Progress Notes (Signed)
 Subjective:  Patient ID: Philip Hughes, male    DOB: 11/04/1986, 38 y.o.   MRN: [id number]  Patient Care Team: Sonny Masters, FNP as PCP - General (Family Medicine)   Chief Complaint:  Fever, Nasal Congestion, Generalized Body Aches, and Chills (X 2 days)   HPI: Philip Hughes is a 38 y.o. male presenting on 02/21/2024 for Fever, Nasal Congestion, Generalized Body Aches, and Chills (X 2 days)   Discussed the use of AI scribe software for clinical note transcription with the patient, who gave verbal consent to proceed.  History of Present Illness   Philip Hughes is a 38 year old male who presents with upper respiratory symptoms.  He began experiencing upper respiratory symptoms on Saturday or Sunday night, starting with an itchy, scratchy throat that worsened by Monday night. By Tuesday, the symptoms had significantly worsened, leading to restless nights on Tuesday and Wednesday. Early this morning, congestion subsided somewhat, and his taste and smell returned. His throat remains slightly irritated, likely due to mouth breathing while asleep and drainage.  He experiences a tingling sensation in his ears, which he associates with fluid buildup from an upper respiratory infection. He has a mild fever, with the highest temperature reaching 99.5 to 99.7 degrees Fahrenheit, along with mild body aches, hot flashes, and cold chills.  He has been around family members who were sick, including his mother who started getting sick today and his niece who was recently sick with a urinary tract infection.  He has a history of frequent strep throat infections until his tonsils were removed at age 59, after which he had strep throat only three times.  He has been swabbed for flu, COVID, and RSV. The flu test returned negative, and he is awaiting results for the other tests.          Relevant past medical, surgical, family, and social history reviewed and updated as indicated.   Allergies and medications reviewed and updated. Data reviewed: Chart in Epic.   Past Medical History:  Diagnosis Date   History of meningitis    age 8   Migraines     Past Surgical History:  Procedure Laterality Date   KNEE ARTHROSCOPY Left 06/11/2015   Procedure: LEFT KNEE ARTHROSCOPY WITH LOOSE BODY REMOVAL;  Surgeon: Vickki Hearing, MD;  Location: [facility];  Service: Orthopedics;  Laterality: Left;   TONSILLECTOMY      Social History   Socioeconomic History   Marital status: Single    Spouse name: Not on file   Number of children: Not on file   Years of education: Not on file   Highest education level: Not on file  Occupational History   Not on file  Tobacco Use   Smoking status: Every Day    Current packs/day: 0.50    Average packs/day: 0.5 packs/day for 6.0 years (3.0 ttl pk-yrs)    Types: Cigarettes   Smokeless tobacco: Never  Vaping Use   Vaping status: Never Used  Substance and Sexual Activity   Alcohol use: Yes    Alcohol/week: 5.0 - 6.0 standard drinks of alcohol    Types: 5 - 6 Cans of beer per week   Drug use: No   Sexual activity: Yes    Birth control/protection: None  Other Topics Concern   Not on file  Social History Narrative   Not on file   Social Drivers of Health   Financial Resource Strain: Not on file  Food  Insecurity: Not on file  Transportation Needs: Not on file  Physical Activity: Not on file  Stress: Not on file  Social Connections: Not on file  Intimate Partner Violence: Not on file    Outpatient Encounter Medications as of 02/21/2024  Medication Sig   Chlorphen-PE-Acetaminophen 4-10-325 MG TABS Take 1 tablet by mouth every 6 (six) hours as needed.   pantoprazole (PROTONIX) 40 MG tablet TAKE 1 TABLET BY MOUTH EVERY DAY   predniSONE (DELTASONE) 20 MG tablet Take 2 tablets (40 mg total) by mouth daily with breakfast for 5 days. 2 po daily for 5 days   [DISCONTINUED] azithromycin (ZITHROMAX) 250 MG tablet Take 500 mg once, then  250 mg for four days   [DISCONTINUED] benzonatate (TESSALON) 200 MG capsule Take 1 capsule (200 mg total) by mouth 3 (three) times daily as needed.   No facility-administered encounter medications on file as of 02/21/2024.    No Known Allergies  Pertinent ROS per HPI, otherwise unremarkable      Objective:  BP 127/84   Pulse (!) 106   Temp 97.8 F (36.6 C)   Ht 5\' 8"  (1.727 m)   Wt 219 lb 3.2 oz (99.4 kg)   SpO2 97%   BMI 33.33 kg/m    Wt Readings from Last 3 Encounters:  02/21/24 219 lb 3.2 oz (99.4 kg)  12/10/23 214 lb 12.8 oz (97.4 kg)  04/19/23 221 lb 3.2 oz (100.3 kg)    Physical Exam Vitals and nursing note reviewed.  Constitutional:      General: He is not in acute distress.    Appearance: Normal appearance. He is obese. He is not ill-appearing, toxic-appearing or diaphoretic.  HENT:     Head: Normocephalic and atraumatic.     Right Ear: Ear canal and external ear normal. A middle ear effusion is present. Tympanic membrane is not erythematous.     Left Ear: Ear canal and external ear normal. A middle ear effusion is present. Tympanic membrane is not erythematous.     Nose: Congestion present.     Mouth/Throat:     Lips: Pink.     Mouth: Mucous membranes are moist.     Pharynx: Posterior oropharyngeal erythema and postnasal drip present. No pharyngeal swelling, oropharyngeal exudate or uvula swelling.  Eyes:     Conjunctiva/sclera: Conjunctivae normal.     Pupils: Pupils are equal, round, and reactive to light.  Cardiovascular:     Rate and Rhythm: Normal rate and regular rhythm.     Heart sounds: Normal heart sounds.  Pulmonary:     Effort: Pulmonary effort is normal.     Breath sounds: Normal breath sounds.  Musculoskeletal:     Cervical back: Neck supple.  Lymphadenopathy:     Cervical: No cervical adenopathy.  Skin:    General: Skin is warm and dry.     Capillary Refill: Capillary refill takes less than 2 seconds.  Neurological:     General: No  focal deficit present.     Mental Status: He is alert and oriented to person, place, and time.  Psychiatric:        Mood and Affect: Mood normal.        Behavior: Behavior normal.        Thought Content: Thought content normal.        Judgment: Judgment normal.     Results for orders placed or performed in visit on 12/10/23  CBC with Differential/Platelet   Collection Time: 12/10/23  4:02 PM  Result Value Ref Range   WBC 5.3 3.4 - 10.8 x10E3/uL   RBC 5.37 4.14 - 5.80 x10E6/uL   Hemoglobin 16.6 13.0 - 17.7 g/dL   Hematocrit 16.1 09.6 - 51.0 %   MCV 92 79 - 97 fL   MCH 30.9 26.6 - 33.0 pg   MCHC 33.5 31.5 - 35.7 g/dL   RDW 04.5 40.9 - 81.1 %   Platelets 256 150 - 450 x10E3/uL   Neutrophils 54 Not Estab. %   Lymphs 29 Not Estab. %   Monocytes 10 Not Estab. %   Eos 6 Not Estab. %   Basos 1 Not Estab. %   Neutrophils Absolute 2.9 1.4 - 7.0 x10E3/uL   Lymphocytes Absolute 1.5 0.7 - 3.1 x10E3/uL   Monocytes Absolute 0.5 0.1 - 0.9 x10E3/uL   EOS (ABSOLUTE) 0.3 0.0 - 0.4 x10E3/uL   Basophils Absolute 0.0 0.0 - 0.2 x10E3/uL   Immature Granulocytes 0 Not Estab. %   Immature Grans (Abs) 0.0 0.0 - 0.1 x10E3/uL  CMP14+EGFR   Collection Time: 12/10/23  4:02 PM  Result Value Ref Range   Glucose 88 70 - 99 mg/dL   BUN 13 6 - 20 mg/dL   Creatinine, Ser 9.14 0.76 - 1.27 mg/dL   eGFR 782 >95 AO/ZHY/8.65   BUN/Creatinine Ratio 14 9 - 20   Sodium 140 134 - 144 mmol/L   Potassium 4.1 3.5 - 5.2 mmol/L   Chloride 103 96 - 106 mmol/L   CO2 23 20 - 29 mmol/L   Calcium 9.1 8.7 - 10.2 mg/dL   Total Protein 6.7 6.0 - 8.5 g/dL   Albumin 4.6 4.1 - 5.1 g/dL   Globulin, Total 2.1 1.5 - 4.5 g/dL   Bilirubin Total 1.4 (H) 0.0 - 1.2 mg/dL   Alkaline Phosphatase 83 44 - 121 IU/L   AST 20 0 - 40 IU/L   ALT 29 0 - 44 IU/L  HepB+HepC+HIV Panel   Collection Time: 12/10/23  4:02 PM  Result Value Ref Range   Hepatitis B Surface Ag Negative Negative   Hep B E Ag Negative Negative   Hep B C IgM  Negative Negative   Hep B Core Total Ab Negative Negative   Hep B E Ab Non Reactive Negative   Hep B Surface Ab, Qual Non Reactive    Hep C Virus Ab Non Reactive Non Reactive   HIV Screen 4th Generation wRfx Non Reactive Non Reactive  Lipid panel   Collection Time: 12/10/23  4:02 PM  Result Value Ref Range   Cholesterol, Total 169 100 - 199 mg/dL   Triglycerides 784 0 - 149 mg/dL   HDL 31 (L) >69 mg/dL   VLDL Cholesterol Cal 19 5 - 40 mg/dL   LDL Chol Calc (NIH) 629 (H) 0 - 99 mg/dL   Chol/HDL Ratio 5.5 (H) 0.0 - 5.0 ratio  RPR   Collection Time: 12/10/23  4:02 PM  Result Value Ref Range   RPR Ser Ql Non Reactive Non Reactive  HSV 1 and 2 Ab, IgG   Collection Time: 12/10/23  4:02 PM  Result Value Ref Range   HSV 1 Glycoprotein G Ab, IgG Reactive (A) Non Reactive   HSV 2 IgG, Type Spec Non Reactive Non Reactive  Ct Ng M genitalium NAA, Urine   Collection Time: 12/10/23  4:10 PM  Result Value Ref Range   Mycoplasma genitalium NAA Negative Negative   Chlamydia trachomatis, NAA Negative Negative   Neisseria gonorrhoeae, NAA Negative Negative  Pertinent labs & imaging results that were available during my care of the patient were reviewed by me and considered in my medical decision making.  Assessment & Plan:  Camdan was seen today for fever, nasal congestion, generalized body aches and chills.  Diagnoses and all orders for this visit:  Nasal congestion -     Veritor Flu A/B Waived -     COVID-19, Flu A+B and RSV  Myalgia -     Veritor Flu A/B Waived -     COVID-19, Flu A+B and RSV  Fever in adult -     Veritor Flu A/B Waived -     COVID-19, Flu A+B and RSV  URI with cough and congestion -     Veritor Flu A/B Waived -     COVID-19, Flu A+B and RSV -     Chlorphen-PE-Acetaminophen 4-10-325 MG TABS; Take 1 tablet by mouth every 6 (six) hours as needed. -     predniSONE (DELTASONE) 20 MG tablet; Take 2 tablets (40 mg total) by mouth daily with breakfast for 5 days.  2 po daily for 5 days     Assessment and Plan    Upper Respiratory Infection (URI) Presents with symptoms consistent with URI: congestion, mild fever (up to 99.13F), body aches, hot flashes, cold chills, scratchy throat, and tingling in ears (likely eustachian tube dysfunction). Flu test negative; COVID-19 and RSV tests pending. Recent contact with ill family members. Discussed symptomatic treatment with Norel (decongestant, antihistamine). Advised on Motrin for additional pain relief and throat lozenges for sore throat. Emphasized hydration and rest. Informed that further evaluation may be necessary if symptoms worsen or new symptoms develop. - Prescribe Norel - Advise Motrin as needed for pain - Recommend throat lozenges - Encourage hydration and rest - Monitor symptoms and report worsening or new symptoms - Provide doctor's note for work absence - Send COVID-19 and RSV tests to lab, results expected tomorrow  Follow-up - Send medications to [facility] - Advise to contact clinic if symptoms worsen or new symptoms develop - Provide work note for absence.          Continue all other maintenance medications.  Follow up plan: Return if symptoms worsen or fail to improve.   Continue healthy lifestyle choices, including diet (rich in fruits, vegetables, and lean proteins, and low in salt and simple carbohydrates) and exercise (at least 30 minutes of moderate physical activity daily).  Educational handout given for URI  The above assessment and management plan was discussed with the patient. The patient verbalized understanding of and has agreed to the management plan. Patient is aware to call the clinic if they develop any new symptoms or if symptoms persist or worsen. Patient is aware when to return to the clinic for a follow-up visit. Patient educated on when it is appropriate to go to the emergency department.   Kari Baars, FNP-C [facility] [phone]

## 2024-02-22 LAB — COVID-19, FLU A+B AND RSV
Influenza A, NAA: NOT DETECTED
Influenza B, NAA: NOT DETECTED
RSV, NAA: NOT DETECTED
SARS-CoV-2, NAA: DETECTED — AB

## 2024-02-22 NOTE — Telephone Encounter (Unsigned)
 Copied from CRM [id number]. Topic: Clinical - Medication Refill >> Feb 22, 2024  2:25 PM Everette Rank wrote: Most Recent Primary Care Visit:  Provider: [name] A  Department: [facility]  Visit Type: MYCHART VIDEO VISIT  Date: 12/21/2023  Medication: paxlovid   Has the patient contacted their pharmacy? No (Agent: If no, request that the patient contact the pharmacy for the refill. If patient does not wish to contact the pharmacy document the reason why and proceed with request.) (Agent: If yes, when and what did the pharmacy advise?)  Is this the correct pharmacy for this prescription? Yes If no, delete pharmacy and type the correct one.  This is the patient's preferred pharmacy:  [phone] [place], [place] - [address] [place] [address] Phone: [phone] Fax: [phone]  [facility] [address] [place] - [address] [place] [address] [place] [address] Phone: [phone] Fax: [phone]   Has the prescription been filled recently? No  Is the patient out of the medication? Yes  Has the patient been seen for an appointment in the last year OR does the patient have an upcoming appointment? Yes  Can we respond through MyChart? Yes  Agent: Please be advised that Rx refills may take up to 3 business days. We ask that you follow-up with your pharmacy.

## 2024-02-27 ENCOUNTER — Ambulatory Visit: Payer: BLUE CROSS/BLUE SHIELD | Admitting: Family Medicine

## 2024-03-07 ENCOUNTER — Encounter: Payer: BC Managed Care – PPO | Admitting: Family Medicine

## 2024-03-21 ENCOUNTER — Other Ambulatory Visit: Payer: Self-pay | Admitting: Family Medicine

## 2024-03-21 DIAGNOSIS — R1013 Epigastric pain: Secondary | ICD-10-CM

## 2024-03-21 NOTE — Telephone Encounter (Signed)
 Copied from CRM [id number]. Topic: Clinical - Medication Refill >> Mar 21, 2024 11:17 AM Fuller Mandril wrote: Most Recent Primary Care Visit:   Medication: pantoprazole (PROTONIX) 40 MG tablet  Has the patient contacted their pharmacy? No - changed pharmacy  (Agent: If no, request that the patient contact the pharmacy for the refill. If patient does not wish to contact the pharmacy document the reason why and proceed with request.) (Agent: If yes, when and what did the pharmacy advise?)  Is this the correct pharmacy for this prescription? No If no, delete pharmacy and type the correct one.  This is the patient's preferred pharmacy:  [facility] Address: [address] [place], [place], [place] [address] Phone: [phone]   Has the prescription been filled recently? N/A  Is the patient out of the medication? Yes  Has the patient been seen for an appointment in the last year OR does the patient have an upcoming appointment? Yes  Can we respond through MyChart? Yes  Agent: Please be advised that Rx refills may take up to 3 business days. We ask that you follow-up with your pharmacy.

## 2024-03-25 ENCOUNTER — Other Ambulatory Visit: Payer: Self-pay | Admitting: Family Medicine

## 2024-03-25 DIAGNOSIS — R1013 Epigastric pain: Secondary | ICD-10-CM

## 2024-03-27 ENCOUNTER — Other Ambulatory Visit: Payer: Self-pay | Admitting: Family Medicine

## 2024-03-27 DIAGNOSIS — R1013 Epigastric pain: Secondary | ICD-10-CM

## 2024-03-27 NOTE — Telephone Encounter (Signed)
 Copied from CRM [id number]. Topic: Clinical - Medication Refill >> Mar 27, 2024  1:36 PM Ja-Kwan M wrote: [facility] but he was told that they did not have a Rx for him.    Most Recent Primary Care Visit:   Medication: pantoprazole (PROTONIX) 40 MG tablet  Has the patient contacted their pharmacy? Yes  Is this the correct pharmacy for this prescription? Yes If no, delete pharmacy and type the correct one.  This is the patient's preferred pharmacy:  [id number] [place] [place] - [address] [place] [address] Phone: [phone] Fax: [phone]  [facility] [address] [place] - [address] [place] [address] [place] [address] Phone: [phone] Fax: [phone]  [facility] [place], [place] - [address] [place] [address] Phone: [phone] Fax: [phone]   Has the prescription been filled recently? No  Is the patient out of the medication? Yes  Has the patient been seen for an appointment in the last year OR does the patient have an upcoming appointment? Yes  Can we respond through MyChart? Yes  Agent: Please be advised that Rx refills may take up to 3 business days. We ask that you follow-up with your pharmacy.

## 2024-03-27 NOTE — Telephone Encounter (Signed)
 Refill 04/30/24

## 2024-04-29 ENCOUNTER — Ambulatory Visit (INDEPENDENT_AMBULATORY_CARE_PROVIDER_SITE_OTHER): Admitting: Family Medicine

## 2024-04-29 ENCOUNTER — Encounter: Payer: Self-pay | Admitting: Family Medicine

## 2024-04-29 VITALS — BP 132/81 | HR 87 | Temp 97.5°F | Ht 68.0 in | Wt 222.0 lb

## 2024-04-29 DIAGNOSIS — I8390 Asymptomatic varicose veins of unspecified lower extremity: Secondary | ICD-10-CM

## 2024-04-29 DIAGNOSIS — M25562 Pain in left knee: Secondary | ICD-10-CM

## 2024-04-29 DIAGNOSIS — L989 Disorder of the skin and subcutaneous tissue, unspecified: Secondary | ICD-10-CM

## 2024-04-29 DIAGNOSIS — L720 Epidermal cyst: Secondary | ICD-10-CM

## 2024-04-29 DIAGNOSIS — L918 Other hypertrophic disorders of the skin: Secondary | ICD-10-CM

## 2024-04-29 DIAGNOSIS — G8929 Other chronic pain: Secondary | ICD-10-CM

## 2024-04-29 DIAGNOSIS — M62838 Other muscle spasm: Secondary | ICD-10-CM

## 2024-04-29 DIAGNOSIS — Z6833 Body mass index (BMI) 33.0-33.9, adult: Secondary | ICD-10-CM | POA: Insufficient documentation

## 2024-04-29 DIAGNOSIS — K219 Gastro-esophageal reflux disease without esophagitis: Secondary | ICD-10-CM

## 2024-04-29 LAB — LIPID PANEL

## 2024-04-29 LAB — BAYER DCA HB A1C WAIVED: HB A1C (BAYER DCA - WAIVED): 5.1 % (ref 4.8–5.6)

## 2024-04-29 NOTE — Progress Notes (Signed)
 Subjective:  Patient ID: Philip Hughes, male    DOB: 09-Sep-1986, 38 y.o.   MRN: [id number]  Patient Care Team: Galvin Jules, FNP as PCP - General (Family Medicine)   Chief Complaint:  Skin Tag (Bilateral eye lids - first noticed last week )   HPI: Philip Hughes is a 38 y.o. male presenting on 04/29/2024 for Skin Tag (Bilateral eye lids - first noticed last week )   Philip Hughes is a 38 year old male who presents with concerns about skin lesions and varicose veins.  He has small lesions on his eyelids, identified as milia, with no significant changes. He is curious about his cause. He mentions a past HPV infection at age 44 but acknowledges it is unrelated.  He describes a dark, raised area at the base of his abdomen, initially thought to be similar to previous flat plaques, but notes it is more like a mole or freckle.  He experiences painful inflammatory bumps in the groin area, particularly after alcohol consumption, a recurring issue for several years. He has a history of folliculitis and has had lesions removed by a dermatologist.  He reports a vein on his foot that sometimes breaks the skin and bleeds, an issue present for approximately 34 years. He does not use compression socks and notes that his large legs may contribute to the problem.  He experiences muscle spasms in his legs, described as 'jumping' from time to time. He manages this by consuming bananas and coconut water daily to address potential potassium deficiency.  He continues to take acid reflux medication daily, which effectively controls his symptoms. If he misses doses for several days, he experiences dietary restrictions and discomfort. No recent back pain or constipation.          Relevant past medical, surgical, family, and social history reviewed and updated as indicated.  Allergies and medications reviewed and updated. Data reviewed: Chart in Epic.   Past Medical History:  Diagnosis Date    History of meningitis    age 34   Migraines     Past Surgical History:  Procedure Laterality Date   KNEE ARTHROSCOPY Left 06/11/2015   Procedure: LEFT KNEE ARTHROSCOPY WITH LOOSE BODY REMOVAL;  Surgeon: Darrin Emerald, MD;  Location: [facility];  Service: Orthopedics;  Laterality: Left;   TONSILLECTOMY      Social History   Socioeconomic History   Marital status: Single    Spouse name: Not on file   Number of children: Not on file   Years of education: Not on file   Highest education level: Not on file  Occupational History   Not on file  Tobacco Use   Smoking status: Every Day    Current packs/day: 0.50    Average packs/day: 0.5 packs/day for 6.0 years (3.0 ttl pk-yrs)    Types: Cigarettes   Smokeless tobacco: Never  Vaping Use   Vaping status: Never Used  Substance and Sexual Activity   Alcohol use: Yes    Alcohol/week: 5.0 - 6.0 standard drinks of alcohol    Types: 5 - 6 Cans of beer per week   Drug use: No   Sexual activity: Yes    Birth control/protection: None  Other Topics Concern   Not on file  Social History Narrative   Not on file   Social Drivers of Health   Financial Resource Strain: Not on file  Food Insecurity: Not on file  Transportation Needs: Not on file  Physical Activity: Not on file  Stress: Not on file  Social Connections: Not on file  Intimate Partner Violence: Not on file    Outpatient Encounter Medications as of 04/29/2024  Medication Sig   pantoprazole  (PROTONIX ) 40 MG tablet TAKE 1 TABLET BY MOUTH EVERY DAY   [DISCONTINUED] Chlorphen-PE-Acetaminophen  4-10-325 MG TABS Take 1 tablet by mouth every 6 (six) hours as needed.   No facility-administered encounter medications on file as of 04/29/2024.    No Known Allergies  Pertinent ROS per HPI, otherwise unremarkable      Objective:  BP 132/81   Pulse 87   Temp (!) 97.5 F (36.4 C)   Ht 5\' 8"  (1.727 m)   Wt 222 lb (100.7 kg)   SpO2 96%   BMI 33.75 kg/m    Wt Readings from  Last 3 Encounters:  04/29/24 222 lb (100.7 kg)  02/21/24 219 lb 3.2 oz (99.4 kg)  12/10/23 214 lb 12.8 oz (97.4 kg)    Physical Exam Vitals and nursing note reviewed. Exam conducted with a chaperone present.  Constitutional:      General: He is not in acute distress.    Appearance: Normal appearance. He is obese. He is not ill-appearing, toxic-appearing or diaphoretic.  HENT:     Head: Normocephalic and atraumatic.     Nose: Nose normal.     Mouth/Throat:     Mouth: Mucous membranes are moist.  Eyes:     Conjunctiva/sclera: Conjunctivae normal.     Pupils: Pupils are equal, round, and reactive to light.  Cardiovascular:     Rate and Rhythm: Normal rate and regular rhythm.     Heart sounds: Normal heart sounds.     Comments: VV to BLE Pulmonary:     Effort: Pulmonary effort is normal.     Breath sounds: Normal breath sounds.  Genitourinary:    Pubic Area: No rash or pubic lice.      Penis: Normal.      Testes: Normal.    Musculoskeletal:     Cervical back: Neck supple.     Left knee: Normal.     Right lower leg: No edema.     Left lower leg: No edema.  Skin:    General: Skin is warm and dry.     Findings: Lesion present.       Neurological:     General: No focal deficit present.     Mental Status: He is alert and oriented to person, place, and time.  Psychiatric:        Mood and Affect: Mood normal.        Behavior: Behavior normal.        Thought Content: Thought content normal.        Judgment: Judgment normal.     Results for orders placed or performed in visit on 02/21/24  COVID-19, Flu A+B and RSV   Collection Time: 02/21/24  8:12 AM   Specimen: Nasopharyngeal(NP) swabs in vial transport medium  Result Value Ref Range   SARS-CoV-2, NAA Detected (A) Not Detected   Influenza A, NAA Not Detected Not Detected   Influenza B, NAA Not Detected Not Detected   RSV, NAA Not Detected Not Detected   Test Information: Comment   Veritor Flu A/B Waived    Collection Time: 02/21/24  8:12 AM  Result Value Ref Range   Influenza A Negative Negative   Influenza B Negative Negative       Pertinent labs & imaging results that  were available during my care of the patient were reviewed by me and considered in my medical decision making.  Assessment & Plan:  Callie was seen today for skin tag.  Diagnoses and all orders for this visit:  Milium -     Ambulatory referral to Dermatology  Varicose veins of ankle -     CBC with Differential/Platelet -     CMP14+EGFR -     Lipid panel  Skin lesion -     Ambulatory referral to Dermatology  Muscle spasticity -     CBC with Differential/Platelet -     CMP14+EGFR -     Magnesium  Gastroesophageal reflux disease without esophagitis -     CBC with Differential/Platelet  BMI 33.0-33.9,adult -     CBC with Differential/Platelet -     CMP14+EGFR -     Lipid panel -     Thyroid  Panel With TSH -     Magnesium -     Bayer DCA Hb A1c Waived  Chronic pain of left knee -     Ambulatory referral to Orthopedic Surgery  Skin tag -     Ambulatory referral to Dermatology      Gastroesophageal reflux disease (GERD) GERD is well-controlled with daily acid reflux medication. Symptoms recur after 5-6 days without medication. - Continue daily acid reflux medication.  Varicose veins Superficial varicose veins present on the foot for approximately 34 years, occasionally rupture and bleed. No signs of deep vein thrombosis or blood clot issues. - Recommend wearing compression socks. - Refer to vascular specialist if veins continually rupture and cause ulcers.  Muscle spasms Intermittent muscle spasms in the legs, possibly related to electrolyte imbalances. - Perform blood work to check potassium, magnesium, and calcium  levels. - Advise dietary intake of potassium-rich foods like bananas and coconut water.  Folliculitis Occasional painful inflammatory bumps in the groin area, possibly exacerbated by  alcohol consumption.  Milia Milia present on eyelids. Small sebaceous-filled cysts, not a significant concern. Salicylic acid wipes and glycolic acid washes recommended for management. - Use salicylic acid wipes and glycolic acid washes on eyelids. - Refer to dermatologist for further management if needed.          Continue all other maintenance medications.  Follow up plan: Return if symptoms worsen or fail to improve.   Continue healthy lifestyle choices, including diet (rich in fruits, vegetables, and lean proteins, and low in salt and simple carbohydrates) and exercise (at least 30 minutes of moderate physical activity daily).   The above assessment and management plan was discussed with the patient. The patient verbalized understanding of and has agreed to the management plan. Patient is aware to call the clinic if they develop any new symptoms or if symptoms persist or worsen. Patient is aware when to return to the clinic for a follow-up visit. Patient educated on when it is appropriate to go to the emergency department.   Kattie Parrot, FNP-C [facility] [phone]

## 2024-04-30 LAB — LIPID PANEL
Cholesterol, Total: 165 mg/dL (ref 100–199)
HDL: 29 mg/dL — ABNORMAL LOW (ref >39)
LDL CALC COMMENT:: 5.7 ratio — ABNORMAL HIGH (ref 0.0–5.0)
LDL Chol Calc (NIH): 112 mg/dL — ABNORMAL HIGH (ref 0–99)
Triglycerides: 131 mg/dL (ref 0–149)
VLDL Cholesterol Cal: 24 mg/dL (ref 5–40)

## 2024-04-30 LAB — CMP14+EGFR
ALT: 35 IU/L (ref 0–44)
AST: 22 IU/L (ref 0–40)
Albumin: 4.4 g/dL (ref 4.1–5.1)
Alkaline Phosphatase: 95 IU/L (ref 44–121)
BUN/Creatinine Ratio: 12 (ref 9–20)
BUN: 10 mg/dL (ref 6–20)
Bilirubin Total: 0.6 mg/dL (ref 0.0–1.2)
CO2: 22 mmol/L (ref 20–29)
Calcium: 9.1 mg/dL (ref 8.7–10.2)
Chloride: 105 mmol/L (ref 96–106)
Creatinine, Ser: 0.84 mg/dL (ref 0.76–1.27)
Globulin, Total: 2.3 g/dL (ref 1.5–4.5)
Glucose: 109 mg/dL — ABNORMAL HIGH (ref 70–99)
Potassium: 4.1 mmol/L (ref 3.5–5.2)
Sodium: 141 mmol/L (ref 134–144)
Total Protein: 6.7 g/dL (ref 6.0–8.5)
eGFR: 117 mL/min/{1.73_m2} (ref >59)

## 2024-04-30 LAB — CBC WITH DIFFERENTIAL/PLATELET
Basophils Absolute: 0 10*3/uL (ref 0.0–0.2)
Basos: 1 %
EOS (ABSOLUTE): 0.2 10*3/uL (ref 0.0–0.4)
Eos: 4 %
Hematocrit: 46.8 % (ref 37.5–51.0)
Hemoglobin: 15.8 g/dL (ref 13.0–17.7)
Immature Grans (Abs): 0 10*3/uL (ref 0.0–0.1)
Immature Granulocytes: 0 %
Lymphocytes Absolute: 1.2 10*3/uL (ref 0.7–3.1)
Lymphs: 27 %
MCH: 31.2 pg (ref 26.6–33.0)
MCHC: 33.8 g/dL (ref 31.5–35.7)
MCV: 93 fL (ref 79–97)
Monocytes Absolute: 0.4 10*3/uL (ref 0.1–0.9)
Monocytes: 9 %
Neutrophils Absolute: 2.7 10*3/uL (ref 1.4–7.0)
Neutrophils: 59 %
Platelets: 240 10*3/uL (ref 150–450)
RBC: 5.06 x10E6/uL (ref 4.14–5.80)
RDW: 12.8 % (ref 11.6–15.4)
WBC: 4.6 10*3/uL (ref 3.4–10.8)

## 2024-04-30 LAB — THYROID PANEL WITH TSH
Free Thyroxine Index: 1.9 (ref 1.2–4.9)
T3 Uptake Ratio: 27 % (ref 24–39)
T4, Total: 7 ug/dL (ref 4.5–12.0)
TSH: 0.912 u[IU]/mL (ref 0.450–4.500)

## 2024-04-30 LAB — MAGNESIUM: Magnesium: 1.9 mg/dL (ref 1.6–2.3)

## 2024-05-29 ENCOUNTER — Ambulatory Visit: Admitting: Family Medicine

## 2024-06-04 ENCOUNTER — Ambulatory Visit (INDEPENDENT_AMBULATORY_CARE_PROVIDER_SITE_OTHER): Admitting: Family Medicine

## 2024-06-04 ENCOUNTER — Encounter: Payer: Self-pay | Admitting: Family Medicine

## 2024-06-04 VITALS — BP 123/78 | HR 93 | Temp 97.9°F | Ht 68.0 in | Wt 221.4 lb

## 2024-06-04 DIAGNOSIS — I8390 Asymptomatic varicose veins of unspecified lower extremity: Secondary | ICD-10-CM

## 2024-06-04 NOTE — Progress Notes (Signed)
 Subjective:  Patient ID: Philip Hughes, male    DOB: [birth year], 38 y.o.   MRN: [id number]  Patient Care Team: Galvin Jules, FNP as PCP - General (Family Medicine)   Chief Complaint:  Varicose Veins (Would like a vein looked at on his right foot.  States it has been there for years but now looks different.)   HPI: Philip Hughes is a 38 y.o. male presenting on 06/04/2024 for Varicose Veins (Would like a vein looked at on his right foot.  States it has been there for years but now looks different.)   Philip Hughes is a 38 year old male who presents with a ruptured varicose vein.  He noticed the ruptured varicose vein while at work, describing it as having a small hematoma with dried blood underneath. Initially, he thought he had a shard in his shoe but later realized it was the varicose vein that had ruptured.  He denies picking at the vein but mentions that it gets rubbed by his shoe. He is concerned about the possibility of an embolism but has not experienced any significant bleeding from the site.  He works at a [employer] facility, which may contribute to prolonged standing and pressure on the veins.        Relevant past medical, surgical, family, and social history reviewed and updated as indicated.  Allergies and medications reviewed and updated. Data reviewed: Chart in Epic.   Past Medical History:  Diagnosis Date   History of meningitis    age 20   Migraines     Past Surgical History:  Procedure Laterality Date   KNEE ARTHROSCOPY Left 06/11/2015   Procedure: LEFT KNEE ARTHROSCOPY WITH LOOSE BODY REMOVAL;  Surgeon: Darrin Emerald, MD;  Location: [facility];  Service: Orthopedics;  Laterality: Left;   TONSILLECTOMY      Social History   Socioeconomic History   Marital status: Single    Spouse name: Not on file   Number of children: Not on file   Years of education: Not on file   Highest education level: Not on file  Occupational History   Not on file   Tobacco Use   Smoking status: Every Day    Current packs/day: 0.50    Average packs/day: 0.5 packs/day for 6.0 years (3.0 ttl pk-yrs)    Types: Cigarettes   Smokeless tobacco: Never  Vaping Use   Vaping status: Never Used  Substance and Sexual Activity   Alcohol use: Yes    Alcohol/week: 5.0 - 6.0 standard drinks of alcohol    Types: 5 - 6 Cans of beer per week   Drug use: No   Sexual activity: Yes    Birth control/protection: None  Other Topics Concern   Not on file  Social History Narrative   Not on file   Social Drivers of Health   Financial Resource Strain: Not on file  Food Insecurity: Not on file  Transportation Needs: Not on file  Physical Activity: Not on file  Stress: Not on file  Social Connections: Not on file  Intimate Partner Violence: Not on file    Outpatient Encounter Medications as of 06/04/2024  Medication Sig   pantoprazole  (PROTONIX ) 40 MG tablet TAKE 1 TABLET BY MOUTH EVERY DAY   No facility-administered encounter medications on file as of 06/04/2024.    No Known Allergies  Pertinent ROS per HPI, otherwise unremarkable      Objective:  BP 123/78   Pulse 93  Temp 97.9 F (36.6 C)   Ht 5' 8 (1.727 m)   Wt 221 lb 6.4 oz (100.4 kg)   SpO2 97%   BMI 33.66 kg/m    Wt Readings from Last 3 Encounters:  06/04/24 221 lb 6.4 oz (100.4 kg)  04/29/24 222 lb (100.7 kg)  02/21/24 219 lb 3.2 oz (99.4 kg)    Physical Exam Vitals and nursing note reviewed.  Constitutional:      General: He is not in acute distress.    Appearance: Normal appearance. He is obese. He is not ill-appearing, toxic-appearing or diaphoretic.  HENT:     Head: Normocephalic and atraumatic.     Mouth/Throat:     Mouth: Mucous membranes are moist.  Eyes:     Conjunctiva/sclera: Conjunctivae normal.     Pupils: Pupils are equal, round, and reactive to light.  Cardiovascular:     Rate and Rhythm: Normal rate and regular rhythm.     Pulses: Normal pulses.     Heart  sounds: Normal heart sounds.     Comments: Scattered, small, VV to bilateral lower extremities. One small area on right anterior ankle that has scabbed over from a recent rupture.  Pulmonary:     Effort: Pulmonary effort is normal.     Breath sounds: Normal breath sounds.  Musculoskeletal:     Right lower leg: No edema.     Left lower leg: No edema.  Skin:    General: Skin is warm and dry.     Capillary Refill: Capillary refill takes less than 2 seconds.  Neurological:     General: No focal deficit present.     Mental Status: He is alert and oriented to person, place, and time.  Psychiatric:        Mood and Affect: Mood normal.        Behavior: Behavior normal.        Thought Content: Thought content normal.        Judgment: Judgment normal.     Results for orders placed or performed in visit on 04/29/24  Bayer DCA Hb A1c Waived   Collection Time: 04/29/24 10:05 AM  Result Value Ref Range   HB A1C (BAYER DCA - WAIVED) 5.1 4.8 - 5.6 %  CBC with Differential/Platelet   Collection Time: 04/29/24 10:08 AM  Result Value Ref Range   WBC 4.6 3.4 - 10.8 x10E3/uL   RBC 5.06 4.14 - 5.80 x10E6/uL   Hemoglobin 15.8 13.0 - 17.7 g/dL   Hematocrit 56.3 87.5 - 51.0 %   MCV 93 79 - 97 fL   MCH 31.2 26.6 - 33.0 pg   MCHC 33.8 31.5 - 35.7 g/dL   RDW 64.3 32.9 - 51.8 %   Platelets 240 150 - 450 x10E3/uL   Neutrophils 59 Not Estab. %   Lymphs 27 Not Estab. %   Monocytes 9 Not Estab. %   Eos 4 Not Estab. %   Basos 1 Not Estab. %   Neutrophils Absolute 2.7 1.4 - 7.0 x10E3/uL   Lymphocytes Absolute 1.2 0.7 - 3.1 x10E3/uL   Monocytes Absolute 0.4 0.1 - 0.9 x10E3/uL   EOS (ABSOLUTE) 0.2 0.0 - 0.4 x10E3/uL   Basophils Absolute 0.0 0.0 - 0.2 x10E3/uL   Immature Granulocytes 0 Not Estab. %   Immature Grans (Abs) 0.0 0.0 - 0.1 x10E3/uL  CMP14+EGFR   Collection Time: 04/29/24 10:08 AM  Result Value Ref Range   Glucose 109 (H) 70 - 99 mg/dL   BUN 10  6 - 20 mg/dL   Creatinine, Ser 1.61 0.76 -  1.27 mg/dL   eGFR 096 >04 VW/UJW/1.19   BUN/Creatinine Ratio 12 9 - 20   Sodium 141 134 - 144 mmol/L   Potassium 4.1 3.5 - 5.2 mmol/L   Chloride 105 96 - 106 mmol/L   CO2 22 20 - 29 mmol/L   Calcium  9.1 8.7 - 10.2 mg/dL   Total Protein 6.7 6.0 - 8.5 g/dL   Albumin 4.4 4.1 - 5.1 g/dL   Globulin, Total 2.3 1.5 - 4.5 g/dL   Bilirubin Total 0.6 0.0 - 1.2 mg/dL   Alkaline Phosphatase 95 44 - 121 IU/L   AST 22 0 - 40 IU/L   ALT 35 0 - 44 IU/L  Lipid panel   Collection Time: 04/29/24 10:08 AM  Result Value Ref Range   Cholesterol, Total 165 100 - 199 mg/dL   Triglycerides 147 0 - 149 mg/dL   HDL 29 (L) >82 mg/dL   VLDL Cholesterol Cal 24 5 - 40 mg/dL   LDL Chol Calc (NIH) 956 (H) 0 - 99 mg/dL   Chol/HDL Ratio 5.7 (H) 0.0 - 5.0 ratio  Thyroid  Panel With TSH   Collection Time: 04/29/24 10:08 AM  Result Value Ref Range   TSH 0.912 0.450 - 4.500 uIU/mL   T4, Total 7.0 4.5 - 12.0 ug/dL   T3 Uptake Ratio 27 24 - 39 %   Free Thyroxine Index 1.9 1.2 - 4.9  Magnesium   Collection Time: 04/29/24 10:08 AM  Result Value Ref Range   Magnesium 1.9 1.6 - 2.3 mg/dL       Pertinent labs & imaging results that were available during my care of the patient were reviewed by me and considered in my medical decision making.  Assessment & Plan:  Philip Hughes was seen today for varicose veins.  Diagnoses and all orders for this visit:  Varicose veins of ankle Ruptured superficial varicose vein He has a ruptured superficial varicose vein on the foot, likely due to irritation from his shoe at work. A small hematoma is present, and the area has hardened, indicating no active bleeding. The condition is common and not associated with deep vein thrombosis or embolism risk. The hardened area resembles a scab and will eventually slough off. Compression socks are recommended to prevent worsening of varicose veins by supporting the vein valves and reducing blood backflow. - Recommend wearing compression socks to  prevent worsening of varicose veins. - Advise holding pressure for ten minutes if bleeding occurs and does not stop. - Instruct to seek medical attention if bleeding persists despite pressure.           Continue all other maintenance medications.  Follow up plan: Return if symptoms worsen or fail to improve.   Continue healthy lifestyle choices, including diet (rich in fruits, vegetables, and lean proteins, and low in salt and simple carbohydrates) and exercise (at least 30 minutes of moderate physical activity daily).  Educational handout given for VV  The above assessment and management plan was discussed with the patient. The patient verbalized understanding of and has agreed to the management plan. Patient is aware to call the clinic if they develop any new symptoms or if symptoms persist or worsen. Patient is aware when to return to the clinic for a follow-up visit. Patient educated on when it is appropriate to go to the emergency department.   Kattie Parrot, FNP-C [facility] [phone]

## 2024-06-25 ENCOUNTER — Other Ambulatory Visit: Payer: Self-pay | Admitting: Family Medicine

## 2024-06-25 DIAGNOSIS — R1013 Epigastric pain: Secondary | ICD-10-CM

## 2024-07-01 ENCOUNTER — Ambulatory Visit: Admitting: Family Medicine

## 2024-07-02 ENCOUNTER — Encounter: Payer: Self-pay | Admitting: Family Medicine

## 2024-07-07 ENCOUNTER — Telehealth: Payer: Self-pay | Admitting: Family Medicine

## 2024-07-07 DIAGNOSIS — R1013 Epigastric pain: Secondary | ICD-10-CM

## 2024-07-07 NOTE — Telephone Encounter (Signed)
 Pantoprazole  prescription 90 day supply sent on 06/25/24 to [facility] pharmacy, called and spoke with pharmacist at [facility] and he states he will call and request the transfer for patient.

## 2024-07-07 NOTE — Telephone Encounter (Signed)
 Copied from CRM [id number]. Topic: Clinical - Medication Refill >> Jul 07, 2024  8:51 AM Miquel SAILOR wrote: Medication: pantoprazole  (PROTONIX ) 40 MG tablet   Has the patient contacted their pharmacy? Yes (Agent: If no, request that the patient contact the pharmacy for the refill. If patient does not wish to contact the pharmacy document the reason why and proceed with request.) (Agent: If yes, when and what did the pharmacy advise?)  This is the patient's preferred pharmacy:   [facility] [place], [place] - [address] [place] [address] Phone: [phone] Fax: [phone]  Is this the correct pharmacy for this prescription? Yes If no, delete pharmacy and type the correct one.   Has the prescription been filled recently? Yes  Is the patient out of the medication? Yes  Has the patient been seen for an appointment in the last year OR does the patient have an upcoming appointment? Yes  Can we respond through MyChart? Yes  Agent: Please be advised that Rx refills may take up to 3 business days. We ask that you follow-up with your pharmacy.

## 2024-08-05 ENCOUNTER — Encounter: Payer: Self-pay | Admitting: Family Medicine

## 2024-08-05 ENCOUNTER — Ambulatory Visit (INDEPENDENT_AMBULATORY_CARE_PROVIDER_SITE_OTHER)

## 2024-08-05 ENCOUNTER — Ambulatory Visit: Admitting: Family Medicine

## 2024-08-05 ENCOUNTER — Ambulatory Visit (INDEPENDENT_AMBULATORY_CARE_PROVIDER_SITE_OTHER): Admitting: Family Medicine

## 2024-08-05 VITALS — BP 127/81 | HR 85 | Temp 97.2°F | Ht 68.0 in | Wt 230.4 lb

## 2024-08-05 DIAGNOSIS — M5441 Lumbago with sciatica, right side: Secondary | ICD-10-CM | POA: Diagnosis not present

## 2024-08-05 MED ORDER — PREDNISONE 20 MG PO TABS: 40.0000 mg | ORAL_TABLET | Freq: Every day | ORAL | 0 refills | Status: AC

## 2024-08-05 NOTE — Progress Notes (Signed)
 Subjective:  Patient ID: Philip Hughes, male    DOB: [birth year], 38 y.o.   MRN: [id number]  Patient Care Team: Severa Rock HERO, FNP as PCP - General (Family Medicine)   Chief Complaint:  Hip Pain (Left x 1 month ago) and Back Pain (X 1 month ago )   HPI: Philip Hughes is a 38 y.o. male presenting on 08/05/2024 for Hip Pain (Left x 1 month ago) and Back Pain (X 1 month ago )   Philip Hughes is a 38 year old male who presents with left lower back pain radiating to the right leg.  He has been experiencing left lower back pain for over a month, which sometimes radiates to the right leg. The pain is described as residual and occasionally shoots down the right leg, which the patient described as feeling like a cramp in his whole leg. 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  He has a history of a left leg injury involving a knee dislocation, resulting in a fracture of his fibula and femur, but does not recall any specific back or hip injuries. His brother has had back issues related to bone spurs, which raises his concern about his own condition.  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 No loss of bowel or bladder function is reported, and he has not experienced significant difficulty walking, except for the cramp-like sensation when the pain radiates down his leg.  For pain management, he has been taking a baby aspirin or half of a regular aspirin daily, primarily for heart health, but acknowledges that it may also serve as an anti-inflammatory.          Relevant past medical, surgical, family, and social history reviewed and updated as indicated.  Allergies and medications reviewed and updated. Data reviewed: Chart in Epic.   Past Medical History:  Diagnosis Date   History of meningitis    age 47   Migraines     Past  Surgical History:  Procedure Laterality Date   KNEE ARTHROSCOPY Left 06/11/2015   Procedure: LEFT KNEE ARTHROSCOPY WITH LOOSE BODY REMOVAL;  Surgeon: Taft FORBES Minerva, MD;  Location: [facility];  Service: Orthopedics;  Laterality: Left;   TONSILLECTOMY      Social History   Socioeconomic History   Marital status: Single    Spouse name: Not on file   Number of children: Not on file   Years of education: Not on file   Highest education level: Not on file  Occupational History   Not on file  Tobacco Use   Smoking status: Every Day    Current packs/day: 0.50    Average packs/day: 0.5 packs/day for 6.0 years (3.0 ttl pk-yrs)    Types: Cigarettes   Smokeless tobacco: Never  Vaping Use   Vaping status: Never Used  Substance and Sexual Activity   Alcohol use: Yes    Alcohol/week: 5.0 - 6.0 standard drinks of alcohol    Types: 5 - 6 Cans of beer per week   Drug use: No   Sexual activity: Yes    Birth control/protection: None  Other Topics Concern   Not on file  Social History Narrative   Not on file   Social Drivers of Health   Financial Resource Strain: Not on file  Food Insecurity: Not on file  Transportation Needs: Not  on file  Physical Activity: Not on file  Stress: Not on file  Social Connections: Not on file  Intimate Partner Violence: Not on file    Outpatient Encounter Medications as of 08/05/2024  Medication Sig   pantoprazole  (PROTONIX ) 40 MG tablet TAKE 1 TABLET BY MOUTH EVERY DAY   predniSONE  (DELTASONE ) 20 MG tablet Take 2 tablets (40 mg total) by mouth daily with breakfast for 5 days.   No facility-administered encounter medications on file as of 08/05/2024.    No Known Allergies  Pertinent ROS per HPI, otherwise unremarkable      Objective:  BP 127/81   Pulse 85   Temp (!) 97.2 F (36.2 C)   Ht 5' 8 (1.727 m)   Wt 230 lb 6.4 oz (104.5 kg)   SpO2 96%   BMI 35.03 kg/m    Wt Readings from Last 3 Encounters:  08/05/24 230 lb 6.4 oz (104.5 kg)   06/04/24 221 lb 6.4 oz (100.4 kg)  04/29/24 222 lb (100.7 kg)    Physical Exam Vitals and nursing note reviewed.  Constitutional:      General: He is not in acute distress.    Appearance: Normal appearance. He is obese. He is not ill-appearing, toxic-appearing or diaphoretic.  HENT:     Head: Normocephalic and atraumatic.     Nose: Nose normal.     Mouth/Throat:     Mouth: Mucous membranes are moist.  Eyes:     Conjunctiva/sclera: Conjunctivae normal.     Pupils: Pupils are equal, round, and reactive to light.  Cardiovascular:     Rate and Rhythm: Normal rate and regular rhythm.     Heart sounds: Normal heart sounds.  Pulmonary:     Effort: Pulmonary effort is normal.     Breath sounds: Normal breath sounds.  Musculoskeletal:     Cervical back: Normal and neck supple.     Thoracic back: Normal.     Lumbar back: Tenderness present. No swelling, edema, deformity, signs of trauma, lacerations, spasms or bony tenderness. Normal range of motion. Positive right straight leg raise test. Negative left straight leg raise test. No scoliosis.     Right hip: Normal.     Left hip: Normal.     Right lower leg: No edema.     Left lower leg: No edema.       Legs:  Skin:    General: Skin is warm and dry.     Capillary Refill: Capillary refill takes less than 2 seconds.  Neurological:     General: No focal deficit present.     Mental Status: He is alert and oriented to person, place, and time.  Psychiatric:        Mood and Affect: Mood normal.        Behavior: Behavior normal.        Thought Content: Thought content normal.        Judgment: Judgment normal.     Results for orders placed or performed in visit on 04/29/24  Bayer DCA Hb A1c Waived   Collection Time: 04/29/24 10:05 AM  Result Value Ref Range   HB A1C (BAYER DCA - WAIVED) 5.1 4.8 - 5.6 %  CBC with Differential/Platelet   Collection Time: 04/29/24 10:08 AM  Result Value Ref Range   WBC 4.6 3.4 - 10.8 x10E3/uL   RBC  5.06 4.14 - 5.80 x10E6/uL   Hemoglobin 15.8 13.0 - 17.7 g/dL   Hematocrit 53.1 62.4 - 51.0 %  MCV 93 79 - 97 fL   MCH 31.2 26.6 - 33.0 pg   MCHC 33.8 31.5 - 35.7 g/dL   RDW 87.1 88.3 - 84.5 %   Platelets 240 150 - 450 x10E3/uL   Neutrophils 59 Not Estab. %   Lymphs 27 Not Estab. %   Monocytes 9 Not Estab. %   Eos 4 Not Estab. %   Basos 1 Not Estab. %   Neutrophils Absolute 2.7 1.4 - 7.0 x10E3/uL   Lymphocytes Absolute 1.2 0.7 - 3.1 x10E3/uL   Monocytes Absolute 0.4 0.1 - 0.9 x10E3/uL   EOS (ABSOLUTE) 0.2 0.0 - 0.4 x10E3/uL   Basophils Absolute 0.0 0.0 - 0.2 x10E3/uL   Immature Granulocytes 0 Not Estab. %   Immature Grans (Abs) 0.0 0.0 - 0.1 x10E3/uL  CMP14+EGFR   Collection Time: 04/29/24 10:08 AM  Result Value Ref Range   Glucose 109 (H) 70 - 99 mg/dL   BUN 10 6 - 20 mg/dL   Creatinine, Ser 9.15 0.76 - 1.27 mg/dL   eGFR 882 >40 fO/fpw/8.26   BUN/Creatinine Ratio 12 9 - 20   Sodium 141 134 - 144 mmol/L   Potassium 4.1 3.5 - 5.2 mmol/L   Chloride 105 96 - 106 mmol/L   CO2 22 20 - 29 mmol/L   Calcium 9.1 8.7 - 10.2 mg/dL   Total Protein 6.7 6.0 - 8.5 g/dL   Albumin 4.4 4.1 - 5.1 g/dL   Globulin, Total 2.3 1.5 - 4.5 g/dL   Bilirubin Total 0.6 0.0 - 1.2 mg/dL   Alkaline Phosphatase 95 44 - 121 IU/L   AST 22 0 - 40 IU/L   ALT 35 0 - 44 IU/L  Lipid panel   Collection Time: 04/29/24 10:08 AM  Result Value Ref Range   Cholesterol, Total 165 100 - 199 mg/dL   Triglycerides 868 0 - 149 mg/dL   HDL 29 (L) >60 mg/dL   VLDL Cholesterol Cal 24 5 - 40 mg/dL   LDL Chol Calc (NIH) 887 (H) 0 - 99 mg/dL   Chol/HDL Ratio 5.7 (H) 0.0 - 5.0 ratio  Thyroid Panel With TSH   Collection Time: 04/29/24 10:08 AM  Result Value Ref Range   TSH 0.912 0.450 - 4.500 uIU/mL   T4, Total 7.0 4.5 - 12.0 ug/dL   T3 Uptake Ratio 27 24 - 39 %   Free Thyroxine Index 1.9 1.2 - 4.9  Magnesium   Collection Time: 04/29/24 10:08 AM  Result Value Ref Range   Magnesium 1.9 1.6 - 2.3 mg/dL        Pertinent labs & imaging results that were available during my care of the patient were reviewed by me and considered in my medical decision making.  Assessment & Plan:  Acea was seen today for hip pain and back pain.  Diagnoses and all orders for this visit:  Acute bilateral low back pain with right-sided sciatica -     DG Lumbar Spine 2-3 Views -     predniSONE (DELTASONE) 20 MG tablet; Take 2 tablets (40 mg total) by mouth daily with breakfast for 5 days.      Low back pain with right-sided sciatica Chronic low back pain for over a month with radiation to the right leg, suggestive of sciatica. 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 Differential includes nerve impingement. No bowel or bladder dysfunction. No significant numbness or tingling associated with the back pain, though tingling in the left arm is related to hemiplegic  migraines. - Order lumbar spine x-ray to evaluate for bone spurs or arthritic changes. - Prescribe prednisone , 2 pills every morning for 5 days. - Provide educational materials on low back pain and sciatica, including exercises. - Advise follow-up if prednisone  is ineffective or if x-ray shows abnormalities.          Continue all other maintenance medications.  Follow up plan: Return if symptoms worsen or fail to improve.   Continue healthy lifestyle choices, including diet (rich in fruits, vegetables, and lean proteins, and low in salt and simple carbohydrates) and exercise (at least 30 minutes of moderate physical activity daily).  Educational handout given for lumbar radiculopathy, sciatica   The above assessment and management plan was discussed with the patient. The patient verbalized understanding of and has agreed to the management plan. Patient is aware to call the clinic if they develop any new symptoms or if symptoms persist or worsen. Patient is aware when to return to the clinic for a follow-up visit.  Patient educated on when it is appropriate to go to the emergency department.   Rosaline Bruns, FNP-C [facility] [phone]

## 2024-08-19 ENCOUNTER — Ambulatory Visit: Payer: Self-pay | Admitting: Family Medicine

## 2024-10-07 ENCOUNTER — Ambulatory Visit: Admitting: Family Medicine

## 2024-10-21 ENCOUNTER — Ambulatory Visit: Admitting: Family Medicine

## 2024-10-27 ENCOUNTER — Other Ambulatory Visit: Payer: Self-pay | Admitting: Family Medicine

## 2024-10-27 DIAGNOSIS — R1013 Epigastric pain: Secondary | ICD-10-CM

## 2024-10-27 NOTE — Telephone Encounter (Unsigned)
 Copied from CRM [id number]. Topic: Clinical - Medication Refill >> Oct 27, 2024 10:00 AM Shamecia H wrote: Medication: pantoprazole  (PROTONIX ) 40 MG tablet  Has the patient contacted their pharmacy? Yes (Agent: If no, request that the patient contact the pharmacy for the refill. If patient does not wish to contact the pharmacy document the reason why and proceed with request.) (Agent: If yes, when and what did the pharmacy advise?)  This is the patient's preferred pharmacy:  [facility] [place], [place] - [address] [place] [address] Phone: [phone] Fax: [phone]  Is this the correct pharmacy for this prescription? Yes If no, delete pharmacy and type the correct one.   Has the prescription been filled recently? No  Is the patient out of the medication? Yes  Has the patient been seen for an appointment in the last year OR does the patient have an upcoming appointment? Yes  Can we respond through MyChart? Yes  Agent: Please be advised that Rx refills may take up to 3 business days. We ask that you follow-up with your pharmacy.

## 2024-10-28 ENCOUNTER — Ambulatory Visit: Admitting: Family Medicine

## 2024-10-28 MED ORDER — PANTOPRAZOLE SODIUM 40 MG PO TBEC: 40.0000 mg | DELAYED_RELEASE_TABLET | Freq: Every day | ORAL | 0 refills | Status: AC

## 2024-11-11 ENCOUNTER — Ambulatory Visit: Admitting: Family Medicine

## 2024-11-12 ENCOUNTER — Encounter: Payer: Self-pay | Admitting: Family Medicine

## 2024-11-14 ENCOUNTER — Encounter: Payer: Self-pay | Admitting: Family Medicine

## 2024-11-14 ENCOUNTER — Ambulatory Visit: Admitting: Family Medicine
